# Patient Record
Sex: Female | Born: 1980 | Race: White | Hispanic: No | Marital: Married | Smoking: Former smoker
Health system: Southern US, Community
[De-identification: ages and names within clinical notes are randomized; demographics above are authoritative.]

## PROBLEM LIST (undated history)

## (undated) DIAGNOSIS — J209 Acute bronchitis, unspecified: Secondary | ICD-10-CM

## (undated) DIAGNOSIS — E119 Type 2 diabetes mellitus without complications: Secondary | ICD-10-CM

## (undated) DIAGNOSIS — Z87442 Personal history of urinary calculi: Secondary | ICD-10-CM

## (undated) DIAGNOSIS — Z8619 Personal history of other infectious and parasitic diseases: Secondary | ICD-10-CM

## (undated) DIAGNOSIS — R7303 Prediabetes: Secondary | ICD-10-CM

## (undated) DIAGNOSIS — D649 Anemia, unspecified: Secondary | ICD-10-CM

## (undated) DIAGNOSIS — F419 Anxiety disorder, unspecified: Secondary | ICD-10-CM

## (undated) DIAGNOSIS — J301 Allergic rhinitis due to pollen: Secondary | ICD-10-CM

## (undated) DIAGNOSIS — N390 Urinary tract infection, site not specified: Secondary | ICD-10-CM

## (undated) HISTORY — DX: Anemia, unspecified: D64.9

## (undated) HISTORY — DX: Type 2 diabetes mellitus without complications: E11.9

## (undated) HISTORY — DX: Urinary tract infection, site not specified: N39.0

## (undated) HISTORY — DX: Acute bronchitis, unspecified: J20.9

## (undated) HISTORY — DX: Allergic rhinitis due to pollen: J30.1

## (undated) HISTORY — DX: Anxiety disorder, unspecified: F41.9

## (undated) HISTORY — DX: Personal history of other infectious and parasitic diseases: Z86.19

## (undated) HISTORY — PX: NO PAST SURGERIES: SHX2092

---
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
## 2000-01-11 ENCOUNTER — Other Ambulatory Visit: Admission: RE | Admit: 2000-01-11 | Discharge: 2000-01-11 | Payer: Self-pay | Admitting: *Deleted

## 2001-03-14 ENCOUNTER — Other Ambulatory Visit: Admission: RE | Admit: 2001-03-14 | Discharge: 2001-03-14 | Payer: Self-pay | Admitting: *Deleted

## 2001-11-19 ENCOUNTER — Other Ambulatory Visit: Admission: RE | Admit: 2001-11-19 | Discharge: 2001-11-19 | Payer: Self-pay | Admitting: *Deleted

## 2002-04-03 ENCOUNTER — Other Ambulatory Visit: Admission: RE | Admit: 2002-04-03 | Discharge: 2002-04-03 | Payer: Self-pay | Admitting: *Deleted

## 2002-10-27 ENCOUNTER — Other Ambulatory Visit: Admission: RE | Admit: 2002-10-27 | Discharge: 2002-10-27 | Payer: Self-pay | Admitting: *Deleted

## 2004-01-15 ENCOUNTER — Other Ambulatory Visit: Admission: RE | Admit: 2004-01-15 | Discharge: 2004-01-15 | Payer: Self-pay | Admitting: Obstetrics and Gynecology

## 2005-01-30 ENCOUNTER — Other Ambulatory Visit: Admission: RE | Admit: 2005-01-30 | Discharge: 2005-01-30 | Payer: Self-pay | Admitting: Obstetrics and Gynecology

## 2007-05-17 LAB — CONVERTED CEMR LAB: Pap Smear: NORMAL

## 2008-04-10 ENCOUNTER — Ambulatory Visit: Payer: Self-pay | Admitting: *Deleted

## 2008-04-10 DIAGNOSIS — D649 Anemia, unspecified: Secondary | ICD-10-CM

## 2008-04-10 DIAGNOSIS — L299 Pruritus, unspecified: Secondary | ICD-10-CM | POA: Insufficient documentation

## 2008-04-10 DIAGNOSIS — F419 Anxiety disorder, unspecified: Secondary | ICD-10-CM | POA: Insufficient documentation

## 2008-04-10 DIAGNOSIS — R599 Enlarged lymph nodes, unspecified: Secondary | ICD-10-CM | POA: Insufficient documentation

## 2008-04-10 DIAGNOSIS — H60399 Other infective otitis externa, unspecified ear: Secondary | ICD-10-CM | POA: Insufficient documentation

## 2008-04-10 DIAGNOSIS — F411 Generalized anxiety disorder: Secondary | ICD-10-CM

## 2008-05-15 ENCOUNTER — Ambulatory Visit: Payer: Self-pay | Admitting: *Deleted

## 2009-02-17 ENCOUNTER — Ambulatory Visit: Payer: Self-pay | Admitting: Internal Medicine

## 2009-02-17 DIAGNOSIS — R300 Dysuria: Secondary | ICD-10-CM

## 2009-09-29 ENCOUNTER — Telehealth: Payer: Self-pay | Admitting: Internal Medicine

## 2010-10-18 NOTE — Progress Notes (Signed)
Summary: Yeast & Sinus Infection  Phone Note Call from Patient Call back at [phone]   Summary of Call: patient called and left voice message requesting a rx for a yeast and sinus infection. Her message states that she is unable to schedule an appointment due to her work schedule and she can not afford the copay as she does not get paid for another week.    Call was returned to patient at [phone], patient was not available, sent to voice mail. Voice message was left informing patient she has not been seen in7 months and she will need and office visit  to address her concerns.  She was advised to cal lback and schedule appointment or with any additional questions that she may have Initial call taken by: Glendell Docker CMA,  September 29, 2009 3:13 PM

## 2011-02-16 ENCOUNTER — Ambulatory Visit: Payer: Self-pay | Admitting: Family Medicine

## 2011-02-20 ENCOUNTER — Encounter: Payer: Self-pay | Admitting: Internal Medicine

## 2011-02-21 ENCOUNTER — Encounter: Payer: Self-pay | Admitting: Family Medicine

## 2011-02-21 ENCOUNTER — Ambulatory Visit (INDEPENDENT_AMBULATORY_CARE_PROVIDER_SITE_OTHER): Payer: BC Managed Care – PPO | Admitting: Family Medicine

## 2011-02-21 ENCOUNTER — Ambulatory Visit: Payer: Self-pay | Admitting: Internal Medicine

## 2011-02-21 ENCOUNTER — Other Ambulatory Visit: Payer: Self-pay | Admitting: Family Medicine

## 2011-02-21 DIAGNOSIS — Z0000 Encounter for general adult medical examination without abnormal findings: Secondary | ICD-10-CM

## 2011-02-21 DIAGNOSIS — R5381 Other malaise: Secondary | ICD-10-CM

## 2011-02-21 DIAGNOSIS — R635 Abnormal weight gain: Secondary | ICD-10-CM | POA: Insufficient documentation

## 2011-02-21 DIAGNOSIS — N921 Excessive and frequent menstruation with irregular cycle: Secondary | ICD-10-CM

## 2011-02-21 DIAGNOSIS — F17200 Nicotine dependence, unspecified, uncomplicated: Secondary | ICD-10-CM

## 2011-02-21 DIAGNOSIS — Z87891 Personal history of nicotine dependence: Secondary | ICD-10-CM | POA: Insufficient documentation

## 2011-02-21 DIAGNOSIS — F411 Generalized anxiety disorder: Secondary | ICD-10-CM

## 2011-02-21 DIAGNOSIS — R5383 Other fatigue: Secondary | ICD-10-CM | POA: Insufficient documentation

## 2011-02-21 NOTE — Progress Notes (Signed)
OFFICE VISIT  02/21/2011   CC:  Chief Complaint  Patient presents with  . Employment Physical    medical paperwork for foster care     HPI:    Patient is a 30 y.o. Caucasian female who presents for routine annual CPE. Sees GYN MD for pap/pelvic (most recent was a few weeks ago). Pt reports about 3-4 mo of excessive wt gain--40 lbs or so, worsening irregularity of menses (LMP 01/05/11), rare episodes of lightheadedness and tremulousness when hungry and it is near mealtime.  Fatigued and excessively sleepy the last few weeks.  Appetite good.  No n/v/d or constipation but +bloated/gassy lately.  No dysuria, urgency, urinary frequency, or hematuria.  No HAs, vision changes, or rash.  No CP, SOB, palpitations, or LE swelling problems.  +Snores but no comment by husband about apneic periods in sleep. Currently does no exercise.  Chronic anxiety relatively unproblematic lately: takes clonaz about 1-2 times per month.  She is going to be a foster parent so she has a health form for me to fill out today.  Past Medical History  Diagnosis Date  . History of chicken pox   . Hay fever     with allergies  . Frequent UTI   . Anemia   . Anxiety     Past Surgical History  Procedure Date  . No past surgeries     denies surgical history    Outpatient Prescriptions Prior to Visit  Medication Sig Dispense Refill  . clonazePAM (KLONOPIN) 0.5 MG tablet Take 0.5 mg by mouth 2 (two) times daily as needed.        . nitrofurantoin (MACRODANTIN) 100 MG capsule Take 100 mg by mouth. Take one tablet by mouth once daily as needed for bladder swelling         No Known Allergies  ROS As per HPI  PE: Blood pressure 114/60, pulse 84, temperature 98.1 F (36.7 C), temperature source Oral, resp. rate 18, height 5' 1.5" (1.562 m), weight 201 lb (91.173 kg), last menstrual period 01/05/2011, SpO2 98.00%. Gen: Alert, well appearing.  Patient is oriented to person, place, time, and situation.   Obese-appearing. HEENT: Scalp without lesions or hair loss.  Ears: EACs clear, normal epithelium.  TMs with good light reflex and landmarks bilaterally.  Eyes: no injection, icteris, swelling, or exudate.  EOMI, PERRLA. Nose: no drainage or turbinate edema/swelling.  No injection or focal lesion.  Mouth: lips without lesion/swelling.  Oral mucosa pink and moist.  Dentition intact and without obvious caries or gingival swelling.  Oropharynx without erythema, exudate, or swelling.  Neck: supple, ROM full.  Carotids 2+ bilat, without bruit.  No lymphadenopathy, thyromegaly, or mass. Chest: symmetric expansion, nonlabored respirations.  Clear and equal breath sounds in all lung fields.   CV: RRR, no m/r/g.  Peripheral pulses 2+ and symmetric. ABD: soft, NT, ND, BS normal.  No hepatospenomegaly or mass.  No bruits. EXT: no clubbing, cyanosis, or edema.   LABS:  UPT today was NEGATIVE  IMPRESSION AND PLAN:  Health maintenance examination Discussed prudent diet, regular physical exercise, wt loss. She'll continue cervical cancer screening through her GYN. Routine annual exam 1 yr.  Weight gain With worsening of her metrorrhagia, fatigue, and occasional periods of dizziness/tremulousness. Eval with TSH, CBC, CMET, HbA1c. UPT negative here today. She says she and her GYN plan on proceeding with a more detailed hormonal evaluation.  Tobacco dependence Encouraged cessation.  Patient not ready to quit at this time.  ANXIETY Stable.  May continue prn use of clonazepam.  No rx given today.   I filled out her health form today so she can participate in foster care program.  FOLLOW UP: Return if symptoms worsen or fail to improve.

## 2011-02-21 NOTE — Assessment & Plan Note (Signed)
Discussed prudent diet, regular physical exercise, wt loss. She'll continue cervical cancer screening through her GYN. Routine annual exam 1 yr.

## 2011-02-21 NOTE — Assessment & Plan Note (Addendum)
With worsening of her metrorrhagia, fatigue, and occasional periods of dizziness/tremulousness. Eval with TSH, CBC, CMET, HbA1c. UPT negative here today. She says she and her GYN plan on proceeding with a more detailed hormonal evaluation.

## 2011-02-21 NOTE — Assessment & Plan Note (Signed)
Encouraged cessation.  Patient not ready to quit at this time.

## 2011-02-21 NOTE — Assessment & Plan Note (Signed)
Stable. May continue prn use of clonazepam.  No rx given today.

## 2011-02-22 ENCOUNTER — Telehealth: Payer: Self-pay | Admitting: Family Medicine

## 2011-02-22 LAB — TSH: TSH: 1.072 u[IU]/mL (ref 0.350–4.500)

## 2011-02-22 LAB — CBC WITH DIFFERENTIAL/PLATELET
Eosinophils Absolute: 0.3 10*3/uL (ref 0.0–0.7)
Hemoglobin: 14.4 g/dL (ref 12.0–15.0)
Lymphs Abs: 3.8 10*3/uL (ref 0.7–4.0)
MCH: 30.6 pg (ref 26.0–34.0)
Monocytes Relative: 9 % (ref 3–12)
Neutro Abs: 6.5 10*3/uL (ref 1.7–7.7)
Neutrophils Relative %: 56 % (ref 43–77)
RBC: 4.7 MIL/uL (ref 3.87–5.11)

## 2011-02-22 LAB — HEMOGLOBIN A1C: Hgb A1c MFr Bld: 5.5 % (ref <5.7)

## 2011-02-22 LAB — COMPREHENSIVE METABOLIC PANEL
Albumin: 4.3 g/dL (ref 3.5–5.2)
CO2: 27 mEq/L (ref 19–32)
Calcium: 9.3 mg/dL (ref 8.4–10.5)
Glucose, Bld: 118 mg/dL — ABNORMAL HIGH (ref 70–99)
Potassium: 4 mEq/L (ref 3.5–5.3)
Sodium: 139 mEq/L (ref 135–145)
Total Protein: 7.1 g/dL (ref 6.0–8.3)

## 2011-02-22 NOTE — Telephone Encounter (Signed)
Patient advised of lab results. See lab note 02/22/2011

## 2011-02-22 NOTE — Telephone Encounter (Signed)
Pt states that she is returning your phone call?

## 2011-03-17 ENCOUNTER — Encounter: Payer: Self-pay | Admitting: Family

## 2011-03-17 ENCOUNTER — Other Ambulatory Visit: Payer: Self-pay | Admitting: Family

## 2011-03-17 ENCOUNTER — Ambulatory Visit (HOSPITAL_BASED_OUTPATIENT_CLINIC_OR_DEPARTMENT_OTHER)
Admission: RE | Admit: 2011-03-17 | Discharge: 2011-03-17 | Disposition: A | Discharge status: Home / Self Care | Payer: BC Managed Care – PPO | Source: Ambulatory Visit | Attending: Family | Admitting: Family

## 2011-03-17 ENCOUNTER — Ambulatory Visit (INDEPENDENT_AMBULATORY_CARE_PROVIDER_SITE_OTHER): Payer: BC Managed Care – PPO | Admitting: Family

## 2011-03-17 ENCOUNTER — Telehealth: Payer: Self-pay | Admitting: Family

## 2011-03-17 DIAGNOSIS — R319 Hematuria, unspecified: Secondary | ICD-10-CM | POA: Insufficient documentation

## 2011-03-17 DIAGNOSIS — N200 Calculus of kidney: Secondary | ICD-10-CM | POA: Insufficient documentation

## 2011-03-17 DIAGNOSIS — N949 Unspecified condition associated with female genital organs and menstrual cycle: Secondary | ICD-10-CM

## 2011-03-17 DIAGNOSIS — Z0189 Encounter for other specified special examinations: Secondary | ICD-10-CM

## 2011-03-17 DIAGNOSIS — R102 Pelvic and perineal pain: Secondary | ICD-10-CM

## 2011-03-17 DIAGNOSIS — Z111 Encounter for screening for respiratory tuberculosis: Secondary | ICD-10-CM

## 2011-03-17 DIAGNOSIS — R109 Unspecified abdominal pain: Secondary | ICD-10-CM | POA: Insufficient documentation

## 2011-03-17 LAB — POCT URINALYSIS DIPSTICK
Protein, UA: NEGATIVE
Spec Grav, UA: 1.01
Urobilinogen, UA: 0.2

## 2011-03-17 MED ORDER — CIPROFLOXACIN HCL 500 MG PO TABS: 500.0000 mg | ORAL_TABLET | Freq: Two times a day (BID) | ORAL | Status: AC

## 2011-03-17 NOTE — Patient Instructions (Addendum)
Please complete your Ct scan and blood work on the first floor today. Call or go to the ED if you develop fever over 101, worsening low back pain, increased blood in the urine, if symptoms worsen or if they do not improve.  Follow up Monday to have your PPD read.

## 2011-03-17 NOTE — Telephone Encounter (Signed)
Called patient,  Reviewed findings of CT scan.  Recommended that she take cipro and that she contact us if symptoms worsen or do not improve.  F/u in 1-2 weeks- sooner if problems or concerns.

## 2011-03-17 NOTE — Assessment & Plan Note (Signed)
30 yr old female with pelvic pain, hematuria.  UA + blood, otherwise unremarkable.  Will send urine for culture, treat empirically with cipro.  Will also send her for CT abd/pelvis with kidney stone protocol to exlude stone.  Check CBC to assess WBC.

## 2011-03-17 NOTE — Progress Notes (Signed)
  Subjective:    Patient ID: Anita Cox, female    DOB: [birth year], 30 y.o.   MRN: [id number]  HPI  Anita Cox is a 30 yr old female who presents today with chief complaint of of pelvic pain.  Pain started about 24 hrs ago. Pain is associated with  + urinary urgency and  + discomfort.  Urine appears "pink" on the tissue.  Denies associated fever.   + bilateral low back pain.  Denies nausea.  Some improvement in her symptoms with the use of Motrin. Symptoms feel similar to her previous symptoms.  Reports + history of UTI's and carries diagnosis of "painful bladder syndrome."    Review of Systems Se HPI  Past Medical History  Diagnosis Date  . History of chicken pox   . Hay fever     with allergies  . Frequent UTI   . Anemia   . Anxiety     History   Social History  . Marital Status: Married    Spouse Name: N/A    Number of Children: N/A  . Years of Education: N/A   Occupational History  . Not on file.   Social History Main Topics  . Smoking status: Current Everyday Smoker  . Smokeless tobacco: Not on file  . Alcohol Use: No  . Drug Use: No  . Sexually Active: Not on file   Other Topics Concern  . Not on file   Social History Narrative  . No narrative on file    Past Surgical History  Procedure Date  . No past surgeries     denies surgical history    Family History  Problem Relation Age of Onset  . Arthritis Mother   . Diabetes      grandmother  . Diabetes Paternal Grandfather   . Diabetes Paternal Grandmother     No Known Allergies  Current Outpatient Prescriptions on File Prior to Visit  Medication Sig Dispense Refill  . clonazePAM (KLONOPIN) 0.5 MG tablet Take 0.5 mg by mouth 2 (two) times daily as needed.        Marland Kitchen DISCONTD: nitrofurantoin (MACRODANTIN) 100 MG capsule Take 100 mg by mouth. Take one tablet by mouth once daily as needed for bladder swelling         BP 96/60  Pulse 78  Temp(Src) 98.2 F (36.8 C) (Oral)  Resp 16  Wt 202 lb  (91.627 kg)  LMP 01/05/2011       Objective:   Physical Exam  Constitutional: She appears well-developed and well-nourished.  Cardiovascular: Normal rate and regular rhythm.   Pulmonary/Chest: Effort normal and breath sounds normal.  Abdominal: Soft. Bowel sounds are normal. She exhibits no distension.       Mild right lower quadrant tenderness to palpation without guarding.  Skin: Skin is warm and dry.  Psychiatric: She has a normal mood and affect. Her behavior is normal. Judgment and thought content normal.  GU: neg CVAT bilaterally        Assessment & Plan:   BP Readings from Last 3 Encounters:  03/17/11 96/60  02/21/11 114/60  02/17/09 106/60

## 2011-03-18 LAB — CBC WITH DIFFERENTIAL/PLATELET
Eosinophils Absolute: 0.4 10*3/uL (ref 0.0–0.7)
Eosinophils Relative: 3 % (ref 0–5)
Lymphs Abs: 5 10*3/uL — ABNORMAL HIGH (ref 0.7–4.0)
MCH: 30.4 pg (ref 26.0–34.0)
MCV: 88.5 fL (ref 78.0–100.0)
Monocytes Absolute: 0.9 10*3/uL (ref 0.1–1.0)
Platelets: 272 10*3/uL (ref 150–400)
RBC: 4.7 MIL/uL (ref 3.87–5.11)
RDW: 12.9 % (ref 11.5–15.5)

## 2011-03-19 LAB — URINE CULTURE: Colony Count: NO GROWTH

## 2011-03-20 ENCOUNTER — Telehealth: Payer: Self-pay | Admitting: Family

## 2011-03-20 ENCOUNTER — Ambulatory Visit: Payer: BC Managed Care – PPO | Admitting: Family

## 2011-03-20 NOTE — Telephone Encounter (Signed)
Urine culture neg.  Called pt's cell, reviewed negative urine culture. She states that she started feeling better yesterday afternoon.

## 2011-12-27 ENCOUNTER — Encounter: Payer: Self-pay | Admitting: Family

## 2011-12-27 ENCOUNTER — Ambulatory Visit (HOSPITAL_BASED_OUTPATIENT_CLINIC_OR_DEPARTMENT_OTHER)
Admission: RE | Admit: 2011-12-27 | Discharge: 2011-12-27 | Disposition: A | Discharge status: Home / Self Care | Payer: BC Managed Care – PPO | Source: Ambulatory Visit | Attending: Family | Admitting: Family

## 2011-12-27 ENCOUNTER — Ambulatory Visit (INDEPENDENT_AMBULATORY_CARE_PROVIDER_SITE_OTHER): Payer: BC Managed Care – PPO | Admitting: Family

## 2011-12-27 VITALS — BP 130/90 | HR 96 | Temp 97.8°F | Resp 18 | Wt 215.1 lb

## 2011-12-27 DIAGNOSIS — J029 Acute pharyngitis, unspecified: Secondary | ICD-10-CM

## 2011-12-27 DIAGNOSIS — R0989 Other specified symptoms and signs involving the circulatory and respiratory systems: Secondary | ICD-10-CM

## 2011-12-27 DIAGNOSIS — J209 Acute bronchitis, unspecified: Secondary | ICD-10-CM | POA: Insufficient documentation

## 2011-12-27 DIAGNOSIS — R059 Cough, unspecified: Secondary | ICD-10-CM | POA: Insufficient documentation

## 2011-12-27 DIAGNOSIS — F17200 Nicotine dependence, unspecified, uncomplicated: Secondary | ICD-10-CM

## 2011-12-27 DIAGNOSIS — H6691 Otitis media, unspecified, right ear: Secondary | ICD-10-CM | POA: Insufficient documentation

## 2011-12-27 DIAGNOSIS — Z0189 Encounter for other specified special examinations: Secondary | ICD-10-CM

## 2011-12-27 DIAGNOSIS — J40 Bronchitis, not specified as acute or chronic: Secondary | ICD-10-CM

## 2011-12-27 DIAGNOSIS — H6690 Otitis media, unspecified, unspecified ear: Secondary | ICD-10-CM

## 2011-12-27 DIAGNOSIS — R05 Cough: Secondary | ICD-10-CM

## 2011-12-27 HISTORY — DX: Acute bronchitis, unspecified: J20.9

## 2011-12-27 LAB — POCT URINE HCG BY VISUAL COLOR COMPARISON TESTS: Preg Test, Ur: NEGATIVE

## 2011-12-27 MED ORDER — BENZONATATE 100 MG PO CAPS: 100.0000 mg | ORAL_CAPSULE | Freq: Three times a day (TID) | ORAL | Status: AC | PRN

## 2011-12-27 MED ORDER — ALBUTEROL SULFATE HFA 108 (90 BASE) MCG/ACT IN AERS: 2.0000 | INHALATION_SPRAY | Freq: Four times a day (QID) | RESPIRATORY_TRACT | Status: DC | PRN

## 2011-12-27 MED ORDER — CEFUROXIME AXETIL 500 MG PO TABS: 500.0000 mg | ORAL_TABLET | Freq: Two times a day (BID) | ORAL | Status: AC

## 2011-12-27 NOTE — Assessment & Plan Note (Addendum)
CXR is negative for pneumonia.  Plan to treat with ceftin and albuterol. Spoke with pt on phone re: cxr results and ceftin rx.

## 2011-12-27 NOTE — Assessment & Plan Note (Signed)
Will rx with ceftin.   

## 2011-12-27 NOTE — Progress Notes (Signed)
  Subjective:    Patient ID: Anita Cox, female    DOB: 1980-11-13, 31 y.o.   MRN: [id number]  HPI  Ms.  Wyszynski is a 31 yr old female who presents today with chief complaint of cough.  She reports that she was sick 1 month ago- went to urgent care- placed on abx.  "got over it."  Last week got sick on vacation-  Had cough, chest congestion- started 1 week ago.  Took some otc meds- helped a little.  Monday- coughed all day.  Yesterday, right ear started to bother her.  Cough is productive of yellow phlegm.  Has a sore throat which is "pretty bad." Denies associated fever.  Still smokes.   Review of Systems See HPI  Past Medical History  Diagnosis Date  . History of chicken pox   . Hay fever     with allergies  . Frequent UTI   . Anemia   . Anxiety     History   Social History  . Marital Status: Married    Spouse Name: N/A    Number of Children: N/A  . Years of Education: N/A   Occupational History  . Not on file.   Social History Main Topics  . Smoking status: Current Everyday Smoker  . Smokeless tobacco: Not on file  . Alcohol Use: No  . Drug Use: No  . Sexually Active: Not on file   Other Topics Concern  . Not on file   Social History Narrative  . No narrative on file    Past Surgical History  Procedure Date  . No past surgeries     denies surgical history    Family History  Problem Relation Age of Onset  . Arthritis Mother   . Diabetes      grandmother  . Diabetes Paternal Grandfather   . Diabetes Paternal Grandmother     No Known Allergies  Current Outpatient Prescriptions on File Prior to Visit  Medication Sig Dispense Refill  . clonazePAM (KLONOPIN) 0.5 MG tablet Take 0.5 mg by mouth 2 (two) times daily as needed.        Marland Kitchen albuterol (PROVENTIL HFA;VENTOLIN HFA) 108 (90 BASE) MCG/ACT inhaler Inhale 2 puffs into the lungs every 6 (six) hours as needed for wheezing.  1 Inhaler  0    BP 130/90  Pulse 96  Temp(Src) 97.8 F (36.6 C) (Oral)   Resp 18  Wt 215 lb 1.3 oz (97.56 kg)  SpO2 99%  LMP 12/13/2011       Objective:   Physical Exam  Constitutional: She appears well-developed and well-nourished. No distress.  HENT:  Right Ear: Tympanic membrane is perforated, erythematous and retracted. Tympanic membrane is not bulging.  Left Ear: Tympanic membrane and ear canal normal.  Mouth/Throat: Posterior oropharyngeal erythema present. No oropharyngeal exudate or posterior oropharyngeal edema.  Cardiovascular: Normal rate and regular rhythm.   No murmur heard. Pulmonary/Chest: Effort normal. She has no wheezes. She has rhonchi in the right middle field and the right lower field.  Psychiatric: She has a normal mood and affect. Her behavior is normal. Judgment and thought content normal.          Assessment & Plan:

## 2011-12-27 NOTE — Patient Instructions (Signed)
Please complete your chest x-ray on the first floor.  Call if symptoms worsen, or if no improvement in 2-3 days.  

## 2011-12-27 NOTE — Assessment & Plan Note (Signed)
We discussed importance of smoking cessation 

## 2012-04-18 ENCOUNTER — Ambulatory Visit (INDEPENDENT_AMBULATORY_CARE_PROVIDER_SITE_OTHER): Payer: BC Managed Care – PPO | Admitting: Family Medicine

## 2012-04-18 ENCOUNTER — Encounter: Payer: Self-pay | Admitting: Family Medicine

## 2012-04-18 VITALS — BP 124/91 | HR 80 | Temp 98.0°F | Ht 61.5 in | Wt 215.8 lb

## 2012-04-18 DIAGNOSIS — J209 Acute bronchitis, unspecified: Secondary | ICD-10-CM

## 2012-04-18 DIAGNOSIS — J40 Bronchitis, not specified as acute or chronic: Secondary | ICD-10-CM

## 2012-04-18 DIAGNOSIS — F17200 Nicotine dependence, unspecified, uncomplicated: Secondary | ICD-10-CM

## 2012-04-18 MED ORDER — CIPROFLOXACIN HCL 500 MG PO TABS: 500.0000 mg | ORAL_TABLET | Freq: Two times a day (BID) | ORAL | Status: AC

## 2012-04-18 MED ORDER — ALBUTEROL SULFATE HFA 108 (90 BASE) MCG/ACT IN AERS: 2.0000 | INHALATION_SPRAY | Freq: Four times a day (QID) | RESPIRATORY_TRACT | Status: DC | PRN

## 2012-04-18 MED ORDER — HYDROCOD POLST-CHLORPHEN POLST 10-8 MG/5ML PO LQCR: 5.0000 mL | Freq: Every evening | ORAL | Status: DC | PRN

## 2012-04-18 NOTE — Assessment & Plan Note (Signed)
Ventolin sample given 2 puffs po q 6 hours prn sob/wheezing. Tussionex prn cough, qhs. Ciprofloxacin 500 mg po bid x 10 days

## 2012-04-18 NOTE — Patient Instructions (Addendum)

## 2012-04-18 NOTE — Progress Notes (Signed)
Patient ID: Anita Cox, female   DOB: 04-26-1981, 31 y.o.   MRN: [id number] Anita Cox [id number] [birth year] 04/18/2012      Progress Note-Follow Up  Subjective  Chief Complaint  Chief Complaint  Patient presents with  . not feeling well    runny nose, headache, chest congestion, cough X 1 week    HPI  Is a 31 year old Caucasian female who is in today with a one-week history of worsening respiratory symptoms. She is a smoker who generally smokes a pack per day but is now down to 7 cigarettes daily. She struggling with nasal congestion productive of clear to whitish thick phlegm. Also complaining of chest congestion and wheezing. Cough is minimally productive thus far. Cough is keeping her up at night. No fevers chills but she struggling with fatigue. No myalgias. Denies ear pain, throat pain, headache, vomiting or diarrhea. Does have some intermittent low-grade nausea. Has used Mucinex intermittently as well as a generic cough and cold suppressant in the last 24 hours. She notes some mild palpitations with coughing but otherwise denies chest pain  Past Medical History  Diagnosis Date  . History of chicken pox   . Hay fever     with allergies  . Frequent UTI   . Anemia   . Anxiety   . Acute bronchitis 12/27/2011    Past Surgical History  Procedure Date  . No past surgeries     denies surgical history    Family History  Problem Relation Age of Onset  . Arthritis Mother   . Diabetes      grandmother  . Diabetes Paternal Grandfather   . Diabetes Paternal Grandmother     History   Social History  . Marital Status: Married    Spouse Name: N/A    Number of Children: N/A  . Years of Education: N/A   Occupational History  . Not on file.   Social History Main Topics  . Smoking status: Current Everyday Smoker  . Smokeless tobacco: Not on file  . Alcohol Use: No  . Drug Use: No  . Sexually Active: Not on file   Other Topics Concern  . Not on file   Social  History Narrative  . No narrative on file    Current Outpatient Prescriptions on File Prior to Visit  Medication Sig Dispense Refill  . clonazePAM (KLONOPIN) 0.5 MG tablet Take 0.5 mg by mouth 2 (two) times daily as needed.        Marland Kitchen albuterol (PROVENTIL HFA;VENTOLIN HFA) 108 (90 BASE) MCG/ACT inhaler Inhale 2 puffs into the lungs every 6 (six) hours as needed for wheezing.  1 Inhaler  0  . metFORMIN (GLUCOPHAGE-XR) 500 MG 24 hr tablet       . VIORELE 0.15-0.02/0.01 MG (21/5) tablet         No Known Allergies  Review of Systems  Review of Systems  Constitutional: Positive for malaise/fatigue. Negative for fever and chills.  HENT: Positive for congestion. Negative for sore throat and ear discharge.   Eyes: Negative for discharge.  Respiratory: Positive for cough, sputum production, shortness of breath and wheezing.   Cardiovascular: Negative for chest pain, palpitations and leg swelling.  Gastrointestinal: Negative for nausea, vomiting, abdominal pain, diarrhea and constipation.  Genitourinary: Negative for dysuria.  Musculoskeletal: Negative for falls.  Skin: Negative for rash.  Neurological: Negative for loss of consciousness and headaches.  Endo/Heme/Allergies: Negative for polydipsia.  Psychiatric/Behavioral: Negative for depression and suicidal ideas. The patient is not  nervous/anxious and does not have insomnia.     Objective  BP 124/91  Pulse 80  Temp 98 F (36.7 C) (Oral)  Ht 5' 1.5" (1.562 m)  Wt 215 lb 12.8 oz (97.886 kg)  BMI 40.11 kg/m2  SpO2 93%  LMP 04/15/2012  Physical Exam  Physical Exam  Constitutional: She is oriented to person, place, and time and well-developed, well-nourished, and in no distress. No distress.  HENT:  Head: Normocephalic and atraumatic.  Eyes: Conjunctivae are normal.  Neck: Neck supple. No thyromegaly present.  Cardiovascular: Normal rate, regular rhythm and normal heart sounds.   No murmur heard. Pulmonary/Chest: She is in  respiratory distress. She has wheezes. She has no rales. She exhibits no tenderness.       Expiratory wheeze b/l bases  Abdominal: She exhibits no distension and no mass.  Musculoskeletal: She exhibits no edema.  Lymphadenopathy:    She has no cervical adenopathy.  Neurological: She is alert and oriented to person, place, and time.  Skin: Skin is warm and dry. No rash noted. She is not diaphoretic.  Psychiatric: Memory, affect and judgment normal.    Lab Results  Component Value Date   TSH 1.072 02/21/2011   Lab Results  Component Value Date   WBC 11.6* 03/17/2011   HGB 14.3 03/17/2011   HCT 41.6 03/17/2011   MCV 88.5 03/17/2011   PLT 272 03/17/2011   Lab Results  Component Value Date   CREATININE 0.68 02/21/2011   BUN 11 02/21/2011   NA 139 02/21/2011   K 4.0 02/21/2011   CL 104 02/21/2011   CO2 27 02/21/2011   Lab Results  Component Value Date   ALT 20 02/21/2011   AST 18 02/21/2011   ALKPHOS 70 02/21/2011   BILITOT 0.2* 02/21/2011     Assessment & Plan  Acute bronchitis Ventolin sample given 2 puffs po q 6 hours prn sob/wheezing. Tussionex prn cough, qhs. Ciprofloxacin 500 mg po bid x 10 days  Tobacco dependence  Patient is down from one pack per day to 7 cigarettes daily now that she feels poorly. She is encouraged not to go back up. She is further encouraged to try avoiding a cigarette and time she craves on for at least 15 minutes and to attempt complete cessation

## 2012-04-18 NOTE — Assessment & Plan Note (Signed)
Patient is down from one pack per day to 7 cigarettes daily now that she feels poorly. She is encouraged not to go back up. She is further encouraged to try avoiding a cigarette and time she craves on for at least 15 minutes and to attempt complete cessation

## 2012-05-01 ENCOUNTER — Encounter: Payer: Self-pay | Admitting: Family

## 2012-05-01 ENCOUNTER — Ambulatory Visit (INDEPENDENT_AMBULATORY_CARE_PROVIDER_SITE_OTHER): Payer: BC Managed Care – PPO | Admitting: Family

## 2012-05-01 VITALS — BP 102/80 | HR 85 | Temp 98.0°F | Resp 16 | Ht 61.5 in | Wt 214.1 lb

## 2012-05-01 DIAGNOSIS — A088 Other specified intestinal infections: Secondary | ICD-10-CM

## 2012-05-01 DIAGNOSIS — A084 Viral intestinal infection, unspecified: Secondary | ICD-10-CM | POA: Insufficient documentation

## 2012-05-01 NOTE — Assessment & Plan Note (Signed)
Recommended that she drink plenty of fluids.  May use immodium prn. Call if symptoms worsen, or if no improvement in 2-3 days.  Given family's illness, likely infectious.  However, if she does not improve, will need to consider stool studies for C. Diff given recent abx use.

## 2012-05-01 NOTE — Progress Notes (Signed)
Subjective:    Patient ID: Anita Cox, female    DOB: [birth year], 31 y.o.   MRN: [id number]  HPI  Anita Cox is a 31 yr old female who presents today for follow up. She reports a 4 day history of diarrhea.   Reports 5-6 stools a day. Denies black/bloody, no fevers.  + abdominal cramping.  Husband had same symptoms last week.  Her 32 year old woke up with diarrhea today.   Review of Systems    see HPI  Past Medical History  Diagnosis Date  . History of chicken pox   . Hay fever     with allergies  . Frequent UTI   . Anemia   . Anxiety   . Acute bronchitis 12/27/2011    History   Social History  . Marital Status: Married    Spouse Name: N/A    Number of Children: N/A  . Years of Education: N/A   Occupational History  . Not on file.   Social History Main Topics  . Smoking status: Current Everyday Smoker -- 0.5 packs/day    Types: Cigarettes  . Smokeless tobacco: Not on file  . Alcohol Use: No  . Drug Use: No  . Sexually Active: Not on file   Other Topics Concern  . Not on file   Social History Narrative  . No narrative on file    Past Surgical History  Procedure Date  . No past surgeries     denies surgical history    Family History  Problem Relation Age of Onset  . Arthritis Mother   . Diabetes      grandmother  . Diabetes Paternal Grandfather   . Diabetes Paternal Grandmother     No Known Allergies  Current Outpatient Prescriptions on File Prior to Visit  Medication Sig Dispense Refill  . clonazePAM (KLONOPIN) 0.5 MG tablet Take 0.5 mg by mouth 2 (two) times daily as needed.        . metFORMIN (GLUCOPHAGE-XR) 500 MG 24 hr tablet Take 500 mg by mouth daily with breakfast.       . VIORELE 0.15-0.02/0.01 MG (21/5) tablet Take 1 tablet by mouth daily.       Marland Kitchen albuterol (PROVENTIL HFA;VENTOLIN HFA) 108 (90 BASE) MCG/ACT inhaler Inhale 2 puffs into the lungs every 6 (six) hours as needed for wheezing.  1 Inhaler  0  . chlorpheniramine-HYDROcodone  (TUSSIONEX PENNKINETIC ER) 10-8 MG/5ML LQCR Take 5 mLs by mouth at bedtime as needed.  140 mL  1  . DISCONTD: albuterol (PROVENTIL HFA;VENTOLIN HFA) 108 (90 BASE) MCG/ACT inhaler Inhale 2 puffs into the lungs every 6 (six) hours as needed for wheezing or shortness of breath.  1 Inhaler  o    BP 102/80  Pulse 85  Temp 98 F (36.7 C) (Oral)  Resp 16  Ht 5' 1.5" (1.562 m)  Wt 214 lb 1.3 oz (97.106 kg)  BMI 39.80 kg/m2  SpO2 99%  LMP 04/15/2012    Objective:   Physical Exam  Constitutional: She appears well-developed and well-nourished. No distress.  Cardiovascular: Normal rate and regular rhythm.   No murmur heard. Pulmonary/Chest: Effort normal and breath sounds normal. No respiratory distress. She has no wheezes. She has no rales. She exhibits no tenderness.  Abdominal: Soft. Bowel sounds are normal. She exhibits no distension. There is no tenderness. There is no rebound and no guarding.  Musculoskeletal: She exhibits no edema.  Skin: Skin is warm and dry.  Psychiatric: She has  a normal mood and affect. Her behavior is normal. Thought content normal.          Assessment & Plan:

## 2012-05-01 NOTE — Patient Instructions (Addendum)
Viral Gastroenteritis Viral gastroenteritis is also known as stomach flu. This condition affects the stomach and intestinal tract. It can cause sudden diarrhea and vomiting. The illness typically lasts 3 to 8 days. Most people develop an immune response that eventually gets rid of the virus. While this natural response develops, the virus can make you quite ill. CAUSES  Many different viruses can cause gastroenteritis, such as rotavirus or noroviruses. You can catch one of these viruses by consuming contaminated food or water. You may also catch a virus by sharing utensils or other personal items with an infected person or by touching a contaminated surface. SYMPTOMS  The most common symptoms are diarrhea and vomiting. These problems can cause a severe loss of body fluids (dehydration) and a body salt (electrolyte) imbalance. Other symptoms may include:  Fever.   Headache.   Fatigue.   Abdominal pain.  DIAGNOSIS  Your caregiver can usually diagnose viral gastroenteritis based on your symptoms and a physical exam. A stool sample may also be taken to test for the presence of viruses or other infections. TREATMENT  This illness typically goes away on its own. Treatments are aimed at rehydration. The most serious cases of viral gastroenteritis involve vomiting so severely that you are not able to keep fluids down. In these cases, fluids must be given through an intravenous line (IV). HOME CARE INSTRUCTIONS   Drink enough fluids to keep your urine clear or pale yellow. Drink small amounts of fluids frequently and increase the amounts as tolerated.   Ask your caregiver for specific rehydration instructions.   Avoid:   Foods high in sugar.   Alcohol.   Carbonated drinks.   Tobacco.   Juice.   Caffeine drinks.   Extremely hot or cold fluids.   Fatty, greasy foods.   Too much intake of anything at one time.   Dairy products until 24 to 48 hours after diarrhea stops.   You may  consume probiotics. Probiotics are active cultures of beneficial bacteria. They may lessen the amount and number of diarrheal stools in adults. Probiotics can be found in yogurt with active cultures and in supplements.   Wash your hands well to avoid spreading the virus.   Only take over-the-counter or prescription medicines for pain, discomfort, or fever as directed by your caregiver. Do not give aspirin to children. Antidiarrheal medicines are not recommended.   Ask your caregiver if you should continue to take your regular prescribed and over-the-counter medicines.   Keep all follow-up appointments as directed by your caregiver.  SEEK IMMEDIATE MEDICAL CARE IF:   You are unable to keep fluids down.   You do not urinate at least once every 6 to 8 hours.   You develop shortness of breath.   You notice blood in your stool or vomit. This may look like coffee grounds.   You have abdominal pain that increases or is concentrated in one small area (localized).   You have persistent vomiting or diarrhea.   You have a fever.   The patient is a child younger than 3 months, and he or she has a fever.   The patient is a child older than 3 months, and he or she has a fever and persistent symptoms.   The patient is a child older than 3 months, and he or she has a fever and symptoms suddenly get worse.   The patient is a baby, and he or she has no tears when crying.  MAKE SURE YOU:     Understand these instructions.   Will watch your condition.   Will get help right away if you are not doing well or get worse.  Document Released: 09/04/2005 Document Revised: 08/24/2011 Document Reviewed: 06/21/2011 [employer] Patient Information 2012 [employer]. 

## 2013-11-03 ENCOUNTER — Encounter: Payer: Self-pay | Admitting: Physician Assistant

## 2013-11-03 ENCOUNTER — Ambulatory Visit (INDEPENDENT_AMBULATORY_CARE_PROVIDER_SITE_OTHER): Payer: No Typology Code available for payment source | Admitting: Physician Assistant

## 2013-11-03 VITALS — BP 117/80 | HR 91 | Temp 98.9°F | Resp 16 | Ht 61.5 in | Wt 223.1 lb

## 2013-11-03 DIAGNOSIS — H6691 Otitis media, unspecified, right ear: Secondary | ICD-10-CM

## 2013-11-03 DIAGNOSIS — J329 Chronic sinusitis, unspecified: Secondary | ICD-10-CM | POA: Diagnosis not present

## 2013-11-03 DIAGNOSIS — H6690 Otitis media, unspecified, unspecified ear: Secondary | ICD-10-CM | POA: Diagnosis not present

## 2013-11-03 MED ORDER — AMOXICILLIN-POT CLAVULANATE 875-125 MG PO TABS: 1.0000 | ORAL_TABLET | Freq: Two times a day (BID) | ORAL | Status: DC

## 2013-11-03 NOTE — Assessment & Plan Note (Addendum)
Patient also with Maxillary sinusitis.  Rx Augmentin.

## 2013-11-03 NOTE — Progress Notes (Signed)
Patient presents to clinic today c/o 2 days of sinus pressure, sinus pain, postnasal drip and dry cough.  Patient also complains of pressure and pain of right ear first noticed upon wakening this am.  Patient also endorses tooth pain.  Patient denies recent travel or sick contact.  Denies change in hearing, tinnitus or ear drainage.  Denies shortness of breath, wheezing or pleuritic chest pain.  Past Medical History  Diagnosis Date  . History of chicken pox   . Hay fever     with allergies  . Frequent UTI   . Anemia   . Anxiety   . Acute bronchitis 12/27/2011    Current Outpatient Prescriptions on File Prior to Visit  Medication Sig Dispense Refill  . clonazePAM (KLONOPIN) 0.5 MG tablet Take 0.5 mg by mouth 2 (two) times daily as needed.        . metFORMIN (GLUCOPHAGE-XR) 500 MG 24 hr tablet Take 500 mg by mouth daily with breakfast.       . VIORELE 0.15-0.02/0.01 MG (21/5) tablet Take 1 tablet by mouth daily.        No current facility-administered medications on file prior to visit.    No Known Allergies  Family History  Problem Relation Age of Onset  . Arthritis Mother   . Diabetes      grandmother  . Diabetes Paternal Grandfather   . Diabetes Paternal Grandmother     History   Social History  . Marital Status: Married    Spouse Name: N/A    Number of Children: N/A  . Years of Education: N/A   Social History Main Topics  . Smoking status: Current Every Day Smoker -- 0.50 packs/day    Types: Cigarettes  . Smokeless tobacco: None  . Alcohol Use: No  . Drug Use: No  . Sexual Activity: None   Other Topics Concern  . None   Social History Narrative  . None    Review of Systems - See HPI.  All other ROS are negative.  BP 117/80  Pulse 91  Temp(Src) 98.9 F (37.2 C) (Oral)  Resp 16  Ht 5' 1.5" (1.562 m)  Wt 223 lb 2 oz (101.209 kg)  BMI 41.48 kg/m2  SpO2 98%  LMP 11/03/2013  Physical Exam  Vitals reviewed. Constitutional: She is oriented to person,  place, and time and well-developed, well-nourished, and in no distress.  HENT:  Head: Normocephalic and atraumatic.  Right Ear: External ear and ear canal normal. Tympanic membrane is erythematous and bulging.  Left Ear: Tympanic membrane, external ear and ear canal normal.  Nose: Nose normal.  Mouth/Throat: Uvula is midline, oropharynx is clear and moist and mucous membranes are normal. No oropharyngeal exudate.  Tenderness to percussion of right maxillary sinus.    Eyes: Conjunctivae are normal. Pupils are equal, round, and reactive to light.  Neck: Neck supple.  Cardiovascular: Normal rate, regular rhythm and normal heart sounds.   Pulmonary/Chest: Effort normal and breath sounds normal. No respiratory distress. She has no wheezes. She has no rales. She exhibits no tenderness.  Lymphadenopathy:    She has no cervical adenopathy.  Neurological: She is alert and oriented to person, place, and time.  Skin: Skin is warm and dry. No rash noted.  Psychiatric: Affect normal.   Assessment/Plan: Right otitis media Patient also with Maxillary sinusitis.  Rx Augmentin.    Sinusitis Rx Augmentin. Take antibiotic as prescribed.  Increase fluid intake.  Rest.  Saline nasal spray. Mucinex. Humidifier in bedroom.  Probiotic.  Please call or return to clinic if symptoms are not improving.

## 2013-11-03 NOTE — Progress Notes (Signed)
Pre visit review using our clinic review tool, if applicable. No additional management support is needed unless otherwise documented below in the visit note/SLS  

## 2013-11-03 NOTE — Patient Instructions (Signed)
Take antibiotic as prescribed.  Increase fluid intake.  Rest.  Saline nasal spray. Mucinex-DM. Humidifier in bedroom. Take a multivitamin.  Please call or return to clinic if symptoms are not improving.  Sinusitis Sinusitis is redness, soreness, and swelling (inflammation) of the paranasal sinuses. Paranasal sinuses are air pockets within the bones of your face (beneath the eyes, the middle of the forehead, or above the eyes). In healthy paranasal sinuses, mucus is able to drain out, and air is able to circulate through them by way of your nose. However, when your paranasal sinuses are inflamed, mucus and air can become trapped. This can allow bacteria and other germs to grow and cause infection. Sinusitis can develop quickly and last only a short time (acute) or continue over a long period (chronic). Sinusitis that lasts for more than 12 weeks is considered chronic.  CAUSES  Causes of sinusitis include:  Allergies.  Structural abnormalities, such as displacement of the cartilage that separates your nostrils (deviated septum), which can decrease the air flow through your nose and sinuses and affect sinus drainage.  Functional abnormalities, such as when the small hairs (cilia) that line your sinuses and help remove mucus do not work properly or are not present. SYMPTOMS  Symptoms of acute and chronic sinusitis are the same. The primary symptoms are pain and pressure around the affected sinuses. Other symptoms include:  Upper toothache.  Earache.  Headache.  Bad breath.  Decreased sense of smell and taste.  A cough, which worsens when you are lying flat.  Fatigue.  Fever.  Thick drainage from your nose, which often is green and may contain pus (purulent).  Swelling and warmth over the affected sinuses. DIAGNOSIS  Your caregiver will perform a physical exam. During the exam, your caregiver may:  Look in your nose for signs of abnormal growths in your nostrils (nasal  polyps).  Tap over the affected sinus to check for signs of infection.  View the inside of your sinuses (endoscopy) with a special imaging device with a light attached (endoscope), which is inserted into your sinuses. If your caregiver suspects that you have chronic sinusitis, one or more of the following tests may be recommended:  Allergy tests.  Nasal culture A sample of mucus is taken from your nose and sent to a lab and screened for bacteria.  Nasal cytology A sample of mucus is taken from your nose and examined by your caregiver to determine if your sinusitis is related to an allergy. TREATMENT  Most cases of acute sinusitis are related to a viral infection and will resolve on their own within 10 days. Sometimes medicines are prescribed to help relieve symptoms (pain medicine, decongestants, nasal steroid sprays, or saline sprays).  However, for sinusitis related to a bacterial infection, your caregiver will prescribe antibiotic medicines. These are medicines that will help kill the bacteria causing the infection.  Rarely, sinusitis is caused by a fungal infection. In theses cases, your caregiver will prescribe antifungal medicine. For some cases of chronic sinusitis, surgery is needed. Generally, these are cases in which sinusitis recurs more than 3 times per year, despite other treatments. HOME CARE INSTRUCTIONS   Drink plenty of water. Water helps thin the mucus so your sinuses can drain more easily.  Use a humidifier.  Inhale steam 3 to 4 times a day (for example, sit in the bathroom with the shower running).  Apply a warm, moist washcloth to your face 3 to 4 times a day, or as directed by  your caregiver.  Use saline nasal sprays to help moisten and clean your sinuses.  Take over-the-counter or prescription medicines for pain, discomfort, or fever only as directed by your caregiver. SEEK IMMEDIATE MEDICAL CARE IF:  You have increasing pain or severe headaches.  You have  nausea, vomiting, or drowsiness.  You have swelling around your face.  You have vision problems.  You have a stiff neck.  You have difficulty breathing. MAKE SURE YOU:   Understand these instructions.  Will watch your condition.  Will get help right away if you are not doing well or get worse. Document Released: 09/04/2005 Document Revised: 11/27/2011 Document Reviewed: 09/19/2011 [facility]Exit[facility] Patient Information 2014 [place], [place].  Otitis Media, Adult Otitis media is redness, soreness, and swelling (inflammation) of the middle ear. Otitis media may be caused by allergies or, most commonly, by infection. Often it occurs as a complication of the common cold. SIGNS AND SYMPTOMS Symptoms of otitis media may include:  Earache.  Fever.  Ringing in your ear.  Headache.  Leakage of fluid from the ear. DIAGNOSIS To diagnose otitis media, your health care provider will examine your ear with an otoscope. This is an instrument that allows your health care provider to see into your ear in order to examine your eardrum. Your health care provider also will ask you questions about your symptoms. TREATMENT  Typically, otitis media resolves on its own within 3 5 days. Your health care provider may prescribe medicine to ease your symptoms of pain. If otitis media does not resolve within 5 days or is recurrent, your health care provider may prescribe antibiotic medicines if he or she suspects that a bacterial infection is the cause. HOME CARE INSTRUCTIONS   Take your medicine as directed until it is gone, even if you feel better after the first few days.  Only take over-the-counter or prescription medicines for pain, discomfort, or fever as directed by your health care provider.  Follow up with your health care provider as directed. SEEK MEDICAL CARE IF:  You have otitis media only in one ear or bleeding from your nose or both.  You notice a lump on your neck.  You are not getting  better in 3 5 days.  You feel worse instead of better. SEEK IMMEDIATE MEDICAL CARE IF:   You have pain that is not controlled with medicine.  You have swelling, redness, or pain around your ear or stiffness in your neck.  You notice that part of your face is paralyzed.  You notice that the bone behind your ear (mastoid) is tender when you touch it. MAKE SURE YOU:   Understand these instructions.  Will watch your condition.  Will get help right away if you are not doing well or get worse. Document Released: 06/09/2004 Document Revised: 06/25/2013 Document Reviewed: 04/01/2013 [facility] Patient Information 2014 [place]Care, [place].

## 2013-11-03 NOTE — Assessment & Plan Note (Signed)
Rx Augmentin. Take antibiotic as prescribed.  Increase fluid intake.  Rest.  Saline nasal spray. Mucinex. Humidifier in bedroom. Probiotic.  Please call or return to clinic if symptoms are not improving.

## 2013-11-05 ENCOUNTER — Telehealth: Payer: Self-pay | Admitting: Family

## 2013-11-05 NOTE — Telephone Encounter (Signed)
Relevant patient education assigned to patient using Emmi. ° °

## 2014-09-14 ENCOUNTER — Ambulatory Visit (INDEPENDENT_AMBULATORY_CARE_PROVIDER_SITE_OTHER): Payer: No Typology Code available for payment source | Admitting: Medical

## 2014-09-14 ENCOUNTER — Encounter: Payer: Self-pay | Admitting: Medical

## 2014-09-14 ENCOUNTER — Ambulatory Visit (HOSPITAL_BASED_OUTPATIENT_CLINIC_OR_DEPARTMENT_OTHER)
Admission: RE | Admit: 2014-09-14 | Discharge: 2014-09-14 | Disposition: A | Discharge status: Home / Self Care | Payer: No Typology Code available for payment source | Source: Ambulatory Visit | Attending: Medical | Admitting: Medical

## 2014-09-14 VITALS — BP 133/85 | HR 92 | Temp 99.0°F | Ht 61.5 in | Wt 212.8 lb

## 2014-09-14 DIAGNOSIS — J189 Pneumonia, unspecified organism: Secondary | ICD-10-CM | POA: Insufficient documentation

## 2014-09-14 DIAGNOSIS — R059 Cough, unspecified: Secondary | ICD-10-CM

## 2014-09-14 DIAGNOSIS — R05 Cough: Secondary | ICD-10-CM

## 2014-09-14 DIAGNOSIS — Z87891 Personal history of nicotine dependence: Secondary | ICD-10-CM | POA: Diagnosis not present

## 2014-09-14 DIAGNOSIS — J209 Acute bronchitis, unspecified: Secondary | ICD-10-CM | POA: Insufficient documentation

## 2014-09-14 DIAGNOSIS — R062 Wheezing: Secondary | ICD-10-CM | POA: Insufficient documentation

## 2014-09-14 MED ORDER — AMOXICILLIN-POT CLAVULANATE 875-125 MG PO TABS: 1.0000 | ORAL_TABLET | Freq: Two times a day (BID) | ORAL | Status: DC

## 2014-09-14 MED ORDER — BENZONATATE 100 MG PO CAPS: 100.0000 mg | ORAL_CAPSULE | Freq: Three times a day (TID) | ORAL | Status: DC | PRN

## 2014-09-14 MED ORDER — BECLOMETHASONE DIPROPIONATE 40 MCG/ACT IN AERS: 2.0000 | INHALATION_SPRAY | Freq: Two times a day (BID) | RESPIRATORY_TRACT | Status: DC

## 2014-09-14 MED ORDER — ALBUTEROL SULFATE HFA 108 (90 BASE) MCG/ACT IN AERS: 2.0000 | INHALATION_SPRAY | Freq: Four times a day (QID) | RESPIRATORY_TRACT | Status: DC | PRN

## 2014-09-14 NOTE — Progress Notes (Signed)
Subjective:    Patient ID: Anita MoralesKaren Y Cox, female    DOB: 11/18/1980, 33 y.o.   MRN: [id number]  HPI   Pt in today reporting cough, nasal congestion and runny nose for 5 Days. Wheezing also. Was bad wheeze on christmas. No hx of asthma. Pt does smoke about a pack a day. Pt smokes 12 years. No productive cough but chest congestion. Dry cough all day.  Associated symptoms( below yes or no)  Fever- last night some. 100.3 Chills-yes Chest congestion-yes Sneezing- no Itching eyes-no Sore throat- yes, mild. Post-nasal drainage-yes Wheezing-yes Purulent nasal  drainage-yes, little. Fatigue-yes.mild.  LMP- 3 and half weeks.   Past Medical History  Diagnosis Date  . History of chicken pox   . Hay fever     with allergies  . Frequent UTI   . Anemia   . Anxiety   . Acute bronchitis 12/27/2011    History   Social History  . Marital Status: Married    Spouse Name: N/A    Number of Children: N/A  . Years of Education: N/A   Occupational History  . Not on file.   Social History Main Topics  . Smoking status: Current Every Day Smoker -- 0.50 packs/day    Types: Cigarettes  . Smokeless tobacco: Not on file  . Alcohol Use: No  . Drug Use: No  . Sexual Activity: Not on file   Other Topics Concern  . Not on file   Social History Narrative    Past Surgical History  Procedure Laterality Date  . No past surgeries      denies surgical history    Family History  Problem Relation Age of Onset  . Arthritis Mother   . Diabetes      grandmother  . Diabetes Paternal Grandfather   . Diabetes Paternal Grandmother     No Known Allergies  Current Outpatient Prescriptions on File Prior to Visit  Medication Sig Dispense Refill  . clonazePAM (KLONOPIN) 0.5 MG tablet Take 0.5 mg by mouth 2 (two) times daily as needed.      . metFORMIN (GLUCOPHAGE-XR) 500 MG 24 hr tablet Take 500 mg by mouth daily with breakfast.     . VIORELE 0.15-0.02/0.01 MG (21/5) tablet Take 1 tablet by  mouth daily.      No current facility-administered medications on file prior to visit.    BP 133/85 mmHg  Pulse 92  Temp(Src) 99 F (37.2 C) (Oral)  Ht 5' 1.5" (1.562 m)  Wt 212 lb 12.8 oz (96.525 kg)  BMI 39.56 kg/m2  SpO2 99%  LMP 08/15/2014       Review of Systems  Constitutional: Positive for fever, chills and fatigue.  HENT: Positive for congestion, postnasal drip, sinus pressure and sore throat.   Respiratory: Positive for cough and wheezing.        Dry cough.  Cardiovascular: Negative for chest pain and palpitations.  Musculoskeletal: Negative for back pain and neck pain.  Neurological: Negative for dizziness and headaches.  Hematological: Negative for adenopathy. Does not bruise/bleed easily.       Objective:   Physical Exam   General  Mental Status - Alert. General Appearance - Well groomed. Not in acute distress.  Skin Rashes- No Rashes.  HEENT Head- Normal. Ear Auditory Canal - Left- Normal. Right - Normal.Tympanic Membrane- Left- Normal. Right- Normal. Eye Sclera/Conjunctiva- Left- Normal. Right- Normal. Nose & Sinuses Nasal Mucosa- Left-  Mild boggy + Congested. Right-  Mild   boggy +Congested.  Left maxillary sinus pressure. Mouth & Throat Lips: Upper Lip- Normal: no dryness, cracking, pallor, cyanosis, or vesicular eruption. Lower Lip-Normal: no dryness, cracking, pallor, cyanosis or vesicular eruption. Buccal Mucosa- Bilateral- No Aphthous ulcers. Oropharynx- No Discharge or Erythema. Tonsils: Characteristics- Bilateral- No Erythema or Congestion. Size/Enlargement- Bilateral- No enlargement. Discharge- bilateral-None.  Neck Neck- Supple. No Masses.   Chest and Lung Exam Auscultation: Breath Sounds:- even and unlabored, but bilateral upper lobe rhonchi.  Cardiovascular Auscultation:Rythm- Regular, rate and rhythm. Murmurs & Other Heart Sounds:Ausculatation of the heart reveal- No Murmurs.  Lymphatic Head & Neck General Head & Neck  Lymphatics: Bilateral: Description- No Localized lymphadenopathy.        Assessment & Plan:

## 2014-09-14 NOTE — Assessment & Plan Note (Signed)
You appear to have bronchitis and sinusitis. Rest hydrate and tylenol for fever. I am prescribing benzonatate cough medicine, and augmentin antibiotic. For your nasal congestion you could use otc the counter nasal steroid.

## 2014-09-14 NOTE — Assessment & Plan Note (Signed)
For your wheezing I am prescribing qvar inhaler and albuterol inhaler.   With recent fever and wheezing I do want to get a chest xray.

## 2014-09-14 NOTE — Patient Instructions (Addendum)
You appear to have bronchitis and sinusitis. Rest hydrate and tylenol for fever. I am prescribing benzonatate cough medicine, and augmentin antibiotic. For your nasal congestion you could use otc the counter nasal steroid.   For your wheezing I am prescribing qvar inhaler and albuterol inhaler.   With recent fever and wheezing I do want to get a chest xray.  Follow up in 7-10 days or as needed

## 2014-09-14 NOTE — Progress Notes (Signed)
Pre visit review using our clinic review tool, if applicable. No additional management support is needed unless otherwise documented below in the visit note. 

## 2015-04-07 ENCOUNTER — Encounter: Payer: Self-pay | Admitting: Internal Medicine

## 2015-04-07 ENCOUNTER — Ambulatory Visit (INDEPENDENT_AMBULATORY_CARE_PROVIDER_SITE_OTHER): Payer: No Typology Code available for payment source | Admitting: Internal Medicine

## 2015-04-07 VITALS — BP 120/64 | HR 84 | Temp 98.4°F | Ht 61.5 in | Wt 223.5 lb

## 2015-04-07 DIAGNOSIS — E282 Polycystic ovarian syndrome: Secondary | ICD-10-CM

## 2015-04-07 DIAGNOSIS — I889 Nonspecific lymphadenitis, unspecified: Secondary | ICD-10-CM

## 2015-04-07 MED ORDER — AMOXICILLIN 500 MG PO CAPS: 1000.0000 mg | ORAL_CAPSULE | Freq: Two times a day (BID) | ORAL | Status: DC

## 2015-04-07 NOTE — Progress Notes (Signed)
Subjective:    Patient ID: Anita MoralesKaren Y Marengo, female    DOB: 03/29/1981, 34 y.o.   MRN: [id number]  DOS:  04/07/2015 Type of visit - description : Acute visit Interval history: Noted swelling and pain at the left side of the jaw approximately 3 days ago. 2 days ago went to urgent care, no diagnosis was made, was recommended observation. The pain and swelling kept going on, pain  was intense this morning, took ibuprofen and this afternoon feels better.    Review of Systems  denies any fever chills. No sore throat No sinus pain or congestion. No rash No dental pain.   Past Medical History  Diagnosis Date  . History of chicken pox   . Hay fever     with allergies  . Frequent UTI   . Anemia   . Anxiety   . Acute bronchitis 12/27/2011    Past Surgical History  Procedure Laterality Date  . No past surgeries      denies surgical history    History   Social History  . Marital Status: Married    Spouse Name: N/A  . Number of Children: N/A  . Years of Education: N/A   Occupational History  . Not on file.   Social History Main Topics  . Smoking status: Current Every Day Smoker -- 0.50 packs/day    Types: Cigarettes  . Smokeless tobacco: Not on file  . Alcohol Use: No  . Drug Use: No  . Sexual Activity: Not on file   Other Topics Concern  . Not on file   Social History Narrative        Medication List       This list is accurate as of: 04/07/15  5:44 PM.  Always use your most recent med list.               amoxicillin 500 MG capsule  Commonly known as:  AMOXIL  Take 2 capsules (1,000 mg total) by mouth 2 (two) times daily.     clonazePAM 0.5 MG tablet  Commonly known as:  KLONOPIN  Take 0.5 mg by mouth 2 (two) times daily as needed.     metFORMIN 500 MG 24 hr tablet  Commonly known as:  GLUCOPHAGE-XR  Take 500 mg by mouth daily with breakfast.     VIORELE 0.15-0.02/0.01 MG (21/5) tablet  Generic drug:  desogestrel-ethinyl estradiol  Take 1 tablet  by mouth daily.           Objective:   Physical Exam BP 120/64 mmHg  Pulse 84  Temp(Src) 98.4 F (36.9 C) (Oral)  Ht 5' 1.5" (1.562 m)  Wt 223 lb 8 oz (101.379 kg)  BMI 41.55 kg/m2  SpO2 97%  LMP 01/06/2015 General:   Well developed, well nourished . NAD.  HEENT:  Normocephalic . Face symmetric, atraumatic TMs normal Throat symmetric, palpation of the gum normal, no pain upon percussion of the teeth. Floor of the mouth is soft. Neck: Left submandibular   gland (salivary?) seems slightly larger and mildly tender to palpation compared to the right side. Parotid glands normal bilaterally  Skin: Not pale. Not jaundice Neurologic:  alert & oriented X3.  Speech normal, gait appropriate for age and unassisted Psych--  Cognition and judgment appear intact.  Cooperative with normal attention span and concentration.  Behavior appropriate. No anxious or depressed appearing.      Assessment & Plan:   Partially resolved lymphadenitis? Salivary gland infection? Plan: Empiric antibiotics, see instructions.

## 2015-04-07 NOTE — Patient Instructions (Signed)
Take the antibiotics as prescribed Drink plenty of fluids OTC ibuprofen as needed Call if not gradually back to normal in few days and call if the swelling increases.

## 2015-04-07 NOTE — Progress Notes (Signed)
Pre visit review using our clinic review tool, if applicable. No additional management support is needed unless otherwise documented below in the visit note. 

## 2016-02-17 LAB — HM PAP SMEAR

## 2016-07-11 ENCOUNTER — Ambulatory Visit (HOSPITAL_BASED_OUTPATIENT_CLINIC_OR_DEPARTMENT_OTHER)
Admission: RE | Admit: 2016-07-11 | Discharge: 2016-07-11 | Disposition: A | Discharge status: Home / Self Care | Payer: Managed Care, Other (non HMO) | Source: Ambulatory Visit | Attending: Family | Admitting: Family

## 2016-07-11 ENCOUNTER — Telehealth: Payer: Self-pay | Admitting: Family

## 2016-07-11 ENCOUNTER — Ambulatory Visit (INDEPENDENT_AMBULATORY_CARE_PROVIDER_SITE_OTHER): Payer: Managed Care, Other (non HMO) | Admitting: Family

## 2016-07-11 ENCOUNTER — Encounter: Payer: Self-pay | Admitting: Family

## 2016-07-11 VITALS — BP 157/86 | HR 83 | Temp 98.3°F | Resp 16 | Ht 61.0 in | Wt 216.2 lb

## 2016-07-11 DIAGNOSIS — N200 Calculus of kidney: Secondary | ICD-10-CM

## 2016-07-11 DIAGNOSIS — M545 Low back pain, unspecified: Secondary | ICD-10-CM

## 2016-07-11 DIAGNOSIS — R109 Unspecified abdominal pain: Secondary | ICD-10-CM

## 2016-07-11 DIAGNOSIS — N132 Hydronephrosis with renal and ureteral calculous obstruction: Secondary | ICD-10-CM | POA: Insufficient documentation

## 2016-07-11 DIAGNOSIS — N138 Other obstructive and reflux uropathy: Secondary | ICD-10-CM

## 2016-07-11 LAB — CBC WITH DIFFERENTIAL/PLATELET
BASOS PCT: 0.5 % (ref 0.0–3.0)
Basophils Absolute: 0.1 10*3/uL (ref 0.0–0.1)
EOS PCT: 0.4 % (ref 0.0–5.0)
Eosinophils Absolute: 0.1 10*3/uL (ref 0.0–0.7)
HCT: 41.4 % (ref 36.0–46.0)
Hemoglobin: 14.2 g/dL (ref 12.0–15.0)
LYMPHS ABS: 2.5 10*3/uL (ref 0.7–4.0)
Lymphocytes Relative: 13.9 % (ref 12.0–46.0)
MCHC: 34.3 g/dL (ref 30.0–36.0)
MCV: 85.9 fl (ref 78.0–100.0)
MONO ABS: 0.7 10*3/uL (ref 0.1–1.0)
Monocytes Relative: 4.1 % (ref 3.0–12.0)
NEUTROS PCT: 81.1 % — AB (ref 43.0–77.0)
Neutro Abs: 14.3 10*3/uL — ABNORMAL HIGH (ref 1.4–7.7)
Platelets: 386 10*3/uL (ref 150.0–400.0)
RBC: 4.82 Mil/uL (ref 3.87–5.11)
RDW: 12.7 % (ref 11.5–15.5)
WBC: 17.6 10*3/uL — ABNORMAL HIGH (ref 4.0–10.5)

## 2016-07-11 LAB — COMPREHENSIVE METABOLIC PANEL
ALK PHOS: 55 U/L (ref 39–117)
ALT: 22 U/L (ref 0–35)
AST: 20 U/L (ref 0–37)
Albumin: 4.3 g/dL (ref 3.5–5.2)
BUN: 16 mg/dL (ref 6–23)
CHLORIDE: 103 meq/L (ref 96–112)
CO2: 24 mEq/L (ref 19–32)
Calcium: 10 mg/dL (ref 8.4–10.5)
Creatinine, Ser: 0.98 mg/dL (ref 0.40–1.20)
GFR: 68.04 mL/min (ref >60.00)
GLUCOSE: 110 mg/dL — AB (ref 70–99)
POTASSIUM: 4.2 meq/L (ref 3.5–5.1)
SODIUM: 137 meq/L (ref 135–145)
TOTAL PROTEIN: 7.9 g/dL (ref 6.0–8.3)
Total Bilirubin: 0.3 mg/dL (ref 0.2–1.2)

## 2016-07-11 LAB — POCT URINALYSIS DIPSTICK
Bilirubin, UA: NEGATIVE
GLUCOSE UA: NEGATIVE
Ketones, UA: NEGATIVE
LEUKOCYTES UA: NEGATIVE
NITRITE UA: NEGATIVE
Spec Grav, UA: >=1.03
UROBILINOGEN UA: NEGATIVE
pH, UA: 6

## 2016-07-11 LAB — POCT URINE PREGNANCY: PREG TEST UR: NEGATIVE

## 2016-07-11 MED ORDER — TAMSULOSIN HCL 0.4 MG PO CAPS: 0.4000 mg | ORAL_CAPSULE | Freq: Every day | ORAL | 0 refills | Status: DC

## 2016-07-11 MED ORDER — CIPROFLOXACIN HCL 500 MG PO TABS: 500.0000 mg | ORAL_TABLET | Freq: Two times a day (BID) | ORAL | 0 refills | Status: AC

## 2016-07-11 MED ORDER — HYDROCODONE-ACETAMINOPHEN 5-325 MG PO TABS: 1.0000 | ORAL_TABLET | Freq: Four times a day (QID) | ORAL | 0 refills | Status: DC | PRN

## 2016-07-11 NOTE — Telephone Encounter (Signed)
Urine strainer left at front dest for patient to pick up. Patient advised.

## 2016-07-11 NOTE — Patient Instructions (Signed)
Please begin cipro for possible urinary tract infection. You may use hydrocodone as needed for pain- but use caution as this medication may cause drowsiness.

## 2016-07-11 NOTE — Progress Notes (Signed)
Pre visit review using our clinic review tool, if applicable. No additional management support is needed unless otherwise documented below in the visit note. 

## 2016-07-11 NOTE — Telephone Encounter (Signed)
Reviewed results of CT scan with pt. Advised pt to strain urine, start flomax, cipro, and will arrange a urology referral. Pt verbalizes understanding. [name], do we have a strainer we could leave for the patient at the front desk please?

## 2016-07-11 NOTE — Progress Notes (Signed)
Subjective:    Patient ID: [name], female    DOB: 1981-04-29, 35 y.o.   MRN: [id number]  HPI  Ms. Scarbrough is a 35 yr old female who presents today with chief complaint of left sided low back pain. Pain is located on the left side, low back. Constant. Pain is 8.5/10.  Denies fever.  + nausea, vomitted x 1 this AM.  Having trouble getting comfortable.  + Dysuria and + frequency. + diarrhea.    Review of Systems    see HPI  Past Medical History:  Diagnosis Date  . Acute bronchitis 12/27/2011  . Anemia   . Anxiety   . Frequent UTI   . Hay fever    with allergies  . History of chicken pox      Social History   Social History  . Marital status: Married    Spouse name: N/A  . Number of children: N/A  . Years of education: N/A   Occupational History  . Not on file.   Social History Main Topics  . Smoking status: Current Every Day Smoker    Packs/day: 1.00    Types: Cigarettes  . Smokeless tobacco: Not on file  . Alcohol use No  . Drug use: No  . Sexual activity: No   Other Topics Concern  . Not on file   Social History Narrative  . No narrative on file    Past Surgical History:  Procedure Laterality Date  . NO PAST SURGERIES     denies surgical history    Family History  Problem Relation Age of Onset  . Arthritis Mother   . Diabetes      grandmother  . Diabetes Paternal Grandfather   . Diabetes Paternal Grandmother     No Known Allergies  Current Outpatient Prescriptions on File Prior to Visit  Medication Sig Dispense Refill  . clonazePAM (KLONOPIN) 0.5 MG tablet Take 0.5 mg by mouth 2 (two) times daily as needed.      . metFORMIN (GLUCOPHAGE-XR) 500 MG 24 hr tablet Take 500 mg by mouth daily with breakfast.      No current facility-administered medications on file prior to visit.     BP (!) 157/86 (BP Location: Right Arm, Patient Position: Sitting, Cuff Size: Large)   Pulse 83   Temp 98.3 F (36.8 C) (Oral)   Resp 16   Ht 5\' 1"  (1.549  m)   Wt 216 lb 3.2 oz (98.1 kg)   LMP 07/10/2016   SpO2 99% Comment: room air  BMI 40.85 kg/m    Objective:   Physical Exam  Constitutional: She appears well-developed and well-nourished.  Cardiovascular: Normal rate, regular rhythm and normal heart sounds.   No murmur heard. Pulmonary/Chest: Effort normal and breath sounds normal. No respiratory distress. She has no wheezes.  Abdominal: Soft. There is tenderness in the suprapubic area, left upper quadrant and left lower quadrant. There is no guarding and no CVA tenderness.  Psychiatric: She has a normal mood and affect. Her behavior is normal. Judgment and thought content normal.          Assessment & Plan:  Kidney stone- UA + blood. CT is performed and reviewed:   Obstructing 7 mm left ureterovesical junction stone with mild tomoderate left hydroureteronephrosis is noted. Will add flomax, rx with empiric cipro, refer to urology for further evaluation. Hydrocodone prn pain. Advised pt to go to strain urine, go to ER if new/worsening symptoms. Pt verbalizes understanding.  Blood  work is pending.

## 2016-07-13 LAB — URINE CULTURE

## 2016-07-14 ENCOUNTER — Other Ambulatory Visit: Payer: Self-pay | Admitting: Urology

## 2016-07-17 ENCOUNTER — Encounter (HOSPITAL_COMMUNITY): Payer: Self-pay | Admitting: *Deleted

## 2016-07-18 ENCOUNTER — Encounter (HOSPITAL_COMMUNITY): Payer: Self-pay | Admitting: *Deleted

## 2016-07-19 ENCOUNTER — Encounter (HOSPITAL_COMMUNITY): Payer: Self-pay | Admitting: *Deleted

## 2016-07-20 ENCOUNTER — Ambulatory Visit (HOSPITAL_COMMUNITY): Admission: RE | Admit: 2016-07-20 | Payer: Managed Care, Other (non HMO) | Source: Ambulatory Visit | Admitting: Urology

## 2016-07-20 HISTORY — DX: Personal history of urinary calculi: Z87.442

## 2016-07-20 HISTORY — DX: Prediabetes: R73.03

## 2016-07-20 SURGERY — LITHOTRIPSY, ESWL
Anesthesia: LOCAL | Laterality: Left

## 2016-07-21 ENCOUNTER — Ambulatory Visit: Payer: Managed Care, Other (non HMO) | Admitting: Family

## 2018-07-01 ENCOUNTER — Ambulatory Visit: Payer: Self-pay

## 2018-07-01 ENCOUNTER — Encounter: Payer: Self-pay | Admitting: Medical

## 2018-07-01 ENCOUNTER — Other Ambulatory Visit: Payer: Self-pay | Admitting: Medical

## 2018-07-01 ENCOUNTER — Ambulatory Visit (INDEPENDENT_AMBULATORY_CARE_PROVIDER_SITE_OTHER): Payer: Managed Care, Other (non HMO) | Admitting: Medical

## 2018-07-01 VITALS — BP 125/69 | HR 76 | Temp 98.3°F | Resp 16 | Ht 61.0 in | Wt 193.4 lb

## 2018-07-01 DIAGNOSIS — R05 Cough: Secondary | ICD-10-CM | POA: Diagnosis not present

## 2018-07-01 DIAGNOSIS — R062 Wheezing: Secondary | ICD-10-CM | POA: Diagnosis not present

## 2018-07-01 DIAGNOSIS — J0100 Acute maxillary sinusitis, unspecified: Secondary | ICD-10-CM

## 2018-07-01 DIAGNOSIS — J40 Bronchitis, not specified as acute or chronic: Secondary | ICD-10-CM | POA: Diagnosis not present

## 2018-07-01 DIAGNOSIS — R059 Cough, unspecified: Secondary | ICD-10-CM

## 2018-07-01 MED ORDER — BENZONATATE 100 MG PO CAPS: 100.0000 mg | ORAL_CAPSULE | Freq: Three times a day (TID) | ORAL | 0 refills | Status: DC | PRN

## 2018-07-01 MED ORDER — FLUTICASONE PROPIONATE 50 MCG/ACT NA SUSP: 2.0000 | Freq: Every day | NASAL | 1 refills | Status: DC

## 2018-07-01 MED ORDER — ALBUTEROL SULFATE HFA 108 (90 BASE) MCG/ACT IN AERS: 2.0000 | INHALATION_SPRAY | Freq: Four times a day (QID) | RESPIRATORY_TRACT | 0 refills | Status: DC | PRN

## 2018-07-01 MED ORDER — AZITHROMYCIN 250 MG PO TABS: ORAL_TABLET | ORAL | 0 refills | Status: DC

## 2018-07-01 NOTE — Telephone Encounter (Signed)
Pt. called to report onset of sinus pressure on Friday, with progression of symptoms, on Saturday, to runny nose, productive cough, intermittent wheeze, and tightness in mid chest.  Denied any fever/ chills; c/o feeling warm and having sweats at intervals.  Denied any shortness of breath.  Reported the chest tightness is located in the sternal area and started with her other symptoms.  Denied any radiation of the chest pain to neck, jaw, or shoulder.  Stated had clear, thin nasal drainage that has changed to thick and yellow.  Reported intermittent blood streaks in nasal drainage, when she blows her nose.  Reported she continues to feel sinus pressure.  Stated has intermittent wheeze. Reported coughing up yellow mucus intermittently.  Reported taking Ibuprofen and Mucinex for above symptoms.  Has taken the day off from work, and is requesting an appt.  Given appt. At 1:40 PM at PCP office.  Pt. Advised to call back or go to ER if has shortness of breath or increase in chest tightness.  Verb. Understanding.  Agreed with plan.        Reason for Disposition . Wheezing is present  Answer Assessment - Initial Assessment Questions 1. ONSET: "When did the cough begin?"      Saturday 2. SEVERITY: "How bad is the cough today?"      Occasional  3. RESPIRATORY DISTRESS: "Describe your breathing."      Denied shortness of breath 4. FEVER: "Do you have a fever?" If so, ask: "What is your temperature, how was it measured, and when did it start?"     Some feeling of hot and sweating 5. SPUTUM: "Describe the color of your sputum" (clear, white, yellow, green)     Yellow 6. HEMOPTYSIS: "Are you coughing up any blood?" If so ask: "How much?" (flecks, streaks, tablespoons, etc.)     Denied blood in sputum 7. CARDIAC HISTORY: "Do you have any history of heart disease?" (e.g., heart attack, congestive heart failure)      denied 8. LUNG HISTORY: "Do you have any history of lung disease?"  (e.g., pulmonary embolus,  asthma, emphysema)     Denied the above 9. PE RISK FACTORS: "Do you have a history of blood clots?" (or: recent major surgery, recent prolonged travel, bedridden)     Not asked 10. OTHER SYMPTOMS: "Do you have any other symptoms?" (e.g., runny nose, wheezing, chest pain)       Started feeling sinus pressure in the brow area on Friday; runny nose was clear mucus and occas. Blood streaks and now is yellow, intermittent wheeze, a little tightness in mid chest since onset of symptoms  11. PREGNANCY: "Is there any chance you are pregnant?" "When was your last menstrual period?"       LMP last week 12. TRAVEL: "Have you traveled out of the country in the last month?" (e.g., travel history, exposures)       denied  Protocols used: COUGH - ACUTE PRODUCTIVE-A-AH

## 2018-07-01 NOTE — Progress Notes (Signed)
Subjective:    Patient ID: Anita Cox, female    DOB: [birth year], 37 y.o.   MRN: [id number]  HPI  Pt in for recent sinus pressure and runny nose since Friday. Worse on Saturday. Pt states clear drainage from nose at first then started to turn yellow. Maxillary sinus hurt worse. Some productive cough. Rare transient wheeze after cough.  LMP- ended on Friday.  Some sneezing over the weekend with some pnd early on.  Pt is on zyrtec daily. Hx of allergies year round.    Review of Systems  Constitutional: Negative for chills, fatigue and fever.  HENT: Positive for congestion, postnasal drip, sinus pressure, sinus pain and sneezing. Negative for trouble swallowing.   Respiratory: Positive for wheezing. Negative for cough, chest tightness and shortness of breath.        Rare wheeze  Cardiovascular: Negative for chest pain and palpitations.  Gastrointestinal: Negative for abdominal pain.  Genitourinary: Negative for dyspareunia, dysuria, enuresis, frequency, hematuria and urgency.  Musculoskeletal: Negative for back pain.  Neurological: Negative for dizziness, seizures, weakness and light-headedness.  Hematological: Negative for adenopathy. Does not bruise/bleed easily.  Psychiatric/Behavioral: Negative for behavioral problems and confusion.   Past Medical History:  Diagnosis Date  . Acute bronchitis 12/27/2011  . Anemia   . Anxiety   . Frequent UTI   . Hay fever    with allergies  . History of chicken pox   . History of kidney stones    07/2016  . Pre-diabetes      Social History   Socioeconomic History  . Marital status: Married    Spouse name: Not on file  . Number of children: Not on file  . Years of education: Not on file  . Highest education level: Not on file  Occupational History  . Not on file  Social Needs  . Financial resource strain: Not on file  . Food insecurity:    Worry: Not on file    Inability: Not on file  . Transportation needs:    Medical:  Not on file    Non-medical: Not on file  Tobacco Use  . Smoking status: Current Every Day Smoker    Packs/day: 1.00    Types: Cigarettes  . Smokeless tobacco: Never Used  Substance and Sexual Activity  . Alcohol use: No  . Drug use: No  . Sexual activity: Never  Lifestyle  . Physical activity:    Days per week: Not on file    Minutes per session: Not on file  . Stress: Not on file  Relationships  . Social connections:    Talks on phone: Not on file    Gets together: Not on file    Attends religious service: Not on file    Active member of club or organization: Not on file    Attends meetings of clubs or organizations: Not on file    Relationship status: Not on file  . Intimate partner violence:    Fear of current or ex partner: Not on file    Emotionally abused: Not on file    Physically abused: Not on file    Forced sexual activity: Not on file  Other Topics Concern  . Not on file  Social History Narrative  . Not on file    Past Surgical History:  Procedure Laterality Date  . NO PAST SURGERIES     denies surgical history    Family History  Problem Relation Age of Onset  . Arthritis Mother   .  Diabetes Unknown        grandmother  . Diabetes Paternal Grandfather   . Diabetes Paternal Grandmother     No Known Allergies  Current Outpatient Medications on File Prior to Visit  Medication Sig Dispense Refill  . cetirizine (ZYRTEC) 10 MG tablet Take 10 mg by mouth daily.    . clonazePAM (KLONOPIN) 0.5 MG tablet Take 0.5 mg by mouth 2 (two) times daily as needed.      . metFORMIN (GLUCOPHAGE-XR) 500 MG 24 hr tablet Take 500 mg by mouth daily with breakfast.     . TRI-PREVIFEM 0.18/0.215/0.25 MG-35 MCG tablet Take 1 tablet by mouth daily.  11   No current facility-administered medications on file prior to visit.     BP 125/69   Pulse 76   Temp 98.3 F (36.8 C) (Oral)   Resp 16   Ht 5\' 1"  (1.549 m)   Wt 193 lb 6.4 oz (87.7 kg)   SpO2 99%   BMI 36.54 kg/m         Objective:   Physical Exam   General  Mental Status - Alert. General Appearance - Well groomed. Not in acute distress.  Skin Rashes- No Rashes.  HEENT Head- Normal. Ear Auditory Canal - Left- Normal. Right - Normal.Tympanic Membrane- Left- Normal. Right- Normal. Eye Sclera/Conjunctiva- Left- Normal. Right- Normal. Nose & Sinuses Nasal Mucosa- Left-  Boggy and Congested. Right-  Boggy and  Congested.Bilateral maxillary but no  frontal sinus pressure. Mouth & Throat Lips: Upper Lip- Normal: no dryness, cracking, pallor, cyanosis, or vesicular eruption. Lower Lip-Normal: no dryness, cracking, pallor, cyanosis or vesicular eruption. Buccal Mucosa- Bilateral- No Aphthous ulcers. Oropharynx- No Discharge or Erythema. Tonsils: Characteristics- Bilateral- No Erythema or Congestion. Size/Enlargement- Bilateral- No enlargement. Discharge- bilateral-None.  Neck Neck- Supple. No Masses.   Chest and Lung Exam Auscultation: Breath Sounds:-even and unlabored. Faint occasional expiratory wheeze.  Cardiovascular Auscultation:Rythm- Regular, rate and rhythm. Murmurs & Other Heart Sounds:Ausculatation of the heart reveal- No Murmurs.  Lymphatic Head & Neck General Head & Neck Lymphatics: Bilateral: Description- No Localized lymphadenopathy.     Assessment & Plan:   You appear to have bronchitis and sinusitis with allergy component. Rest hydrate and tylenol for fever. I am prescribing cough medicine benzonatate, and azithromycin antibiotic. For your nasal congestion rx flonase  You should gradually get better. If not then notify us and would recommend a chest xray.  For mild wheeze presently making albuterol inhaler available.  Follow up in 7-10 days or as needed  [employer], [employer]

## 2018-07-01 NOTE — Patient Instructions (Addendum)
You appear to have bronchitis and sinusitis with allergy component. Rest hydrate and tylenol for fever. I am prescribing cough medicine benzonatate, and azithromycin antibiotic. For your nasal congestion rx flonase  You should gradually get better. If not then notify us and would recommend a chest xray.  For mild wheeze presently making albuterol inhaler available.  Follow up in 7-10 days or as needed

## 2018-07-03 ENCOUNTER — Other Ambulatory Visit: Payer: Self-pay | Admitting: Medical

## 2018-10-23 ENCOUNTER — Ambulatory Visit: Payer: Self-pay | Admitting: *Deleted

## 2018-10-23 NOTE — Telephone Encounter (Signed)
Pt initially calling to report nausea, onset this AM.  During triage process, pt reporting multiple symptoms.  States onset of nausea this am, no vomiting. Has been able to eat as usual, staying hydrated. No diarrhea. Also reports sinus tenderness, intermittent lightheadedness, "Mild" sore throat, post nasal drainage. Reports "Mild body aches." States unsure if febrile but has chills alternating with diaphoresis. Pt states several of her coworkers have recently been diagnosed with the flu. Reports increased fatigue today. Appt made with E. Saguier for tomorrow; care advise given per protocol. Pt verbalizes understanding.   Reason for Disposition . Fever present > 3 days (72 hours)    "Feels hot" onset this AM; but reports multiple symptoms, around co- workers with flu.  Answer Assessment - Initial Assessment Questions 1. NAUSEA SEVERITY: "How bad is the nausea?" (e.g., mild, moderate, severe; dehydration, weight loss)   - MILD: loss of appetite without change in eating habits   - MODERATE: decreased oral intake without significant weight loss, dehydration, or malnutrition   - SEVERE: inadequate caloric or fluid intake, significant weight loss, symptoms of dehydration     mild 2. ONSET: "When did the nausea begin?"     This evening 3. VOMITING: "Any vomiting?" If so, ask: "How many times today?"     no 4. RECURRENT SYMPTOM: "Have you had nausea before?" If so, ask: "When was the last time?" "What happened that time?"     no 5. CAUSE: "What do you think is causing the nausea?"     Unsure 6. PREGNANCY: "Is there any chance you are pregnant?" (e.g., unprotected intercourse, missed birth control pill, broken condom)    no  Protocols used: NAUSEA-A-AH

## 2018-10-24 ENCOUNTER — Ambulatory Visit: Payer: Managed Care, Other (non HMO) | Admitting: Medical

## 2019-01-15 ENCOUNTER — Telehealth: Payer: Self-pay | Admitting: *Deleted

## 2019-01-15 NOTE — Telephone Encounter (Signed)
Left detailed message for pt to call and verify PCP. If she is still seeing Melissa for PCP she needs CPE scheduled some time in July.  If she has any acute concerns at this time could discuss virtual visit and schedule if appropriate. If she is no longer seeing Melissa for PCP, please update PCP status in Epic.

## 2019-07-01 ENCOUNTER — Telehealth: Payer: Managed Care, Other (non HMO) | Admitting: Nurse Practitioner

## 2019-07-01 DIAGNOSIS — J0100 Acute maxillary sinusitis, unspecified: Secondary | ICD-10-CM

## 2019-07-01 MED ORDER — AMOXICILLIN-POT CLAVULANATE 875-125 MG PO TABS: 1.0000 | ORAL_TABLET | Freq: Two times a day (BID) | ORAL | 0 refills | Status: DC

## 2019-07-01 NOTE — Progress Notes (Signed)

## 2019-11-28 LAB — HM PAP SMEAR: HM Pap smear: NEGATIVE

## 2020-08-27 ENCOUNTER — Other Ambulatory Visit: Payer: Self-pay

## 2020-08-27 ENCOUNTER — Ambulatory Visit (INDEPENDENT_AMBULATORY_CARE_PROVIDER_SITE_OTHER): Payer: Managed Care, Other (non HMO) | Admitting: Medical

## 2020-08-27 VITALS — BP 138/84 | HR 93 | Temp 98.2°F | Resp 18 | Ht 61.0 in | Wt 208.0 lb

## 2020-08-27 DIAGNOSIS — L723 Sebaceous cyst: Secondary | ICD-10-CM | POA: Diagnosis not present

## 2020-08-27 DIAGNOSIS — Z23 Encounter for immunization: Secondary | ICD-10-CM

## 2020-08-27 MED ORDER — DOXYCYCLINE HYCLATE 100 MG PO TABS: 100.0000 mg | ORAL_TABLET | Freq: Two times a day (BID) | ORAL | 0 refills | Status: DC

## 2020-08-27 NOTE — Patient Instructions (Signed)
You do appear to have sebaceous cyst with possible infection. Rx doxycycline twice daily for 10 days. Apply warm compress twice daily.  Give me my chart update on Monday.  Might refer to dermatologist for cyst removal attempt.  Follow up 7 days or as needed

## 2020-08-27 NOTE — Progress Notes (Signed)
Subjective:    Patient ID: Anita Cox, female    DOB: 1981/01/29, 39 y.o.   MRN: [id number]  HPI  Pt in for a lump that pt states came up on her back. Pt states sometimes has some irritation to area.   Pt states noticed around thankgiving. For 2 days it hurts. Then pain decreased. Not itches intermittently with pain. Then wed and Thursday this week pain again.   lmp- last Friday     Review of Systems  Constitutional: Negative for chills, fatigue and fever.  Respiratory: Negative for cough, chest tightness, shortness of breath and wheezing.   Cardiovascular: Negative for chest pain and palpitations.  Gastrointestinal: Negative for abdominal pain.  Musculoskeletal: Negative for back pain.  Skin:       Rt side swollen area upper back.  Neurological: Negative for dizziness and headaches.  Hematological: Negative for adenopathy. Does not bruise/bleed easily.  Psychiatric/Behavioral: Negative for behavioral problems.    Past Medical History:  Diagnosis Date  . Acute bronchitis 12/27/2011  . Anemia   . Anxiety   . Frequent UTI   . Hay fever    with allergies  . History of chicken pox   . History of kidney stones    07/2016  . Pre-diabetes      Social History   Socioeconomic History  . Marital status: Married    Spouse name: Not on file  . Number of children: Not on file  . Years of education: Not on file  . Highest education level: Not on file  Occupational History  . Not on file  Tobacco Use  . Smoking status: Current Every Day Smoker    Packs/day: 1.00    Types: Cigarettes  . Smokeless tobacco: Never Used  Substance and Sexual Activity  . Alcohol use: No  . Drug use: No  . Sexual activity: Never  Other Topics Concern  . Not on file  Social History Narrative  . Not on file   Social Determinants of Health   Financial Resource Strain: Not on file  Food Insecurity: Not on file  Transportation Needs: Not on file  Physical Activity: Not on file   Stress: Not on file  Social Connections: Not on file  Intimate Partner Violence: Not on file    Past Surgical History:  Procedure Laterality Date  . NO PAST SURGERIES     denies surgical history    Family History  Problem Relation Age of Onset  . Arthritis Mother   . Diabetes Unknown        grandmother  . Diabetes Paternal Grandfather   . Diabetes Paternal Grandmother     No Known Allergies  Current Outpatient Medications on File Prior to Visit  Medication Sig Dispense Refill  . clonazePAM (KLONOPIN) 0.5 MG tablet Take 0.5 mg by mouth 2 (two) times daily as needed.    . metFORMIN (GLUCOPHAGE-XR) 500 MG 24 hr tablet Take 500 mg by mouth daily with breakfast.     . TRI-PREVIFEM 0.18/0.215/0.25 MG-35 MCG tablet Take 1 tablet by mouth daily.  11  . albuterol (PROVENTIL HFA;VENTOLIN HFA) 108 (90 Base) MCG/ACT inhaler Inhale 2 puffs into the lungs every 6 (six) hours as needed for wheezing or shortness of breath. 1 Inhaler 0  . benzonatate (TESSALON) 100 MG capsule Take 1 capsule (100 mg total) by mouth 3 (three) times daily as needed for cough. 30 capsule 0  . cetirizine (ZYRTEC) 10 MG tablet Take 10 mg by mouth daily.    Marland Kitchen  fluticasone (FLONASE) 50 MCG/ACT nasal spray Place 2 sprays into both nostrils daily. 16 g 1   No current facility-administered medications on file prior to visit.    BP 138/84   Pulse 93   Temp 98.2 F (36.8 C)   Resp 18   Ht 5\' 1"  (1.549 m)   Wt 208 lb (94.3 kg)   LMP 08/20/2020   SpO2 98%   BMI 39.30 kg/m       Objective:   Physical Exam   General- No acute distress. Pleasant patient. Neck- Full range of motion, no jvd Lungs- Clear, even and unlabored. Heart- regular rate and rhythm. Neurologic- CNII- XII grossly intact. Skin- rt side lower trapezius area 2.0 cm approximate sized sebacious cyst. Mild tender. No fluctuance.     Assessment & Plan:  You do appear to have sebaceous cyst with possible infection. Rx doxycycline twice daily  for 10 days. Apply warm compress twice daily.  Give me my chart update on Monday.  Might refer to dermatologist for cyst removal attempt.  Follow up 7 days or as needed    Wednesday, [employer]

## 2020-09-28 ENCOUNTER — Ambulatory Visit (INDEPENDENT_AMBULATORY_CARE_PROVIDER_SITE_OTHER): Payer: Managed Care, Other (non HMO) | Admitting: Family

## 2020-09-28 ENCOUNTER — Encounter: Payer: Self-pay | Admitting: Family

## 2020-09-28 ENCOUNTER — Telehealth: Payer: Self-pay | Admitting: Family

## 2020-09-28 ENCOUNTER — Other Ambulatory Visit: Payer: Self-pay

## 2020-09-28 VITALS — BP 131/74 | HR 81 | Temp 98.5°F | Resp 14 | Ht 61.0 in | Wt 211.4 lb

## 2020-09-28 DIAGNOSIS — R635 Abnormal weight gain: Secondary | ICD-10-CM

## 2020-09-28 DIAGNOSIS — Z0000 Encounter for general adult medical examination without abnormal findings: Secondary | ICD-10-CM

## 2020-09-28 DIAGNOSIS — J329 Chronic sinusitis, unspecified: Secondary | ICD-10-CM

## 2020-09-28 DIAGNOSIS — R739 Hyperglycemia, unspecified: Secondary | ICD-10-CM

## 2020-09-28 DIAGNOSIS — D72829 Elevated white blood cell count, unspecified: Secondary | ICD-10-CM

## 2020-09-28 DIAGNOSIS — Z23 Encounter for immunization: Secondary | ICD-10-CM

## 2020-09-28 LAB — CBC WITH DIFFERENTIAL/PLATELET
Basophils Absolute: 0.1 10*3/uL (ref 0.0–0.1)
Basophils Relative: 0.6 % (ref 0.0–3.0)
Eosinophils Absolute: 0.3 10*3/uL (ref 0.0–0.7)
Eosinophils Relative: 2.2 % (ref 0.0–5.0)
HCT: 41.1 % (ref 36.0–46.0)
Hemoglobin: 13.9 g/dL (ref 12.0–15.0)
Lymphocytes Relative: 30 % (ref 12.0–46.0)
Lymphs Abs: 3.7 10*3/uL (ref 0.7–4.0)
MCHC: 33.9 g/dL (ref 30.0–36.0)
MCV: 90.3 fl (ref 78.0–100.0)
Monocytes Absolute: 0.8 10*3/uL (ref 0.1–1.0)
Monocytes Relative: 6.4 % (ref 3.0–12.0)
Neutro Abs: 7.5 10*3/uL (ref 1.4–7.7)
Neutrophils Relative %: 60.8 % (ref 43.0–77.0)
Platelets: 295 10*3/uL (ref 150.0–400.0)
RBC: 4.55 Mil/uL (ref 3.87–5.11)
RDW: 12.4 % (ref 11.5–15.5)
WBC: 12.4 10*3/uL — ABNORMAL HIGH (ref 4.0–10.5)

## 2020-09-28 LAB — COMPREHENSIVE METABOLIC PANEL
ALT: 11 U/L (ref 0–35)
AST: 12 U/L (ref 0–37)
Albumin: 4.1 g/dL (ref 3.5–5.2)
Alkaline Phosphatase: 51 U/L (ref 39–117)
BUN: 9 mg/dL (ref 6–23)
CO2: 26 mEq/L (ref 19–32)
Calcium: 9.1 mg/dL (ref 8.4–10.5)
Chloride: 101 mEq/L (ref 96–112)
Creatinine, Ser: 0.66 mg/dL (ref 0.40–1.20)
GFR: 109.45 mL/min (ref >60.00)
Glucose, Bld: 84 mg/dL (ref 70–99)
Potassium: 4.2 mEq/L (ref 3.5–5.1)
Sodium: 135 mEq/L (ref 135–145)
Total Bilirubin: 0.3 mg/dL (ref 0.2–1.2)
Total Protein: 7 g/dL (ref 6.0–8.3)

## 2020-09-28 LAB — LIPID PANEL
Cholesterol: 179 mg/dL (ref 0–200)
HDL: 37.6 mg/dL — ABNORMAL LOW (ref >39.00)
NonHDL: 141.62
Total CHOL/HDL Ratio: 5
Triglycerides: 316 mg/dL — ABNORMAL HIGH (ref 0.0–149.0)
VLDL: 63.2 mg/dL — ABNORMAL HIGH (ref 0.0–40.0)

## 2020-09-28 LAB — LDL CHOLESTEROL, DIRECT: Direct LDL: 113 mg/dL

## 2020-09-28 LAB — TSH: TSH: 0.96 u[IU]/mL (ref 0.35–4.50)

## 2020-09-28 LAB — HEMOGLOBIN A1C: Hgb A1c MFr Bld: 5.7 % (ref 4.6–6.5)

## 2020-09-28 NOTE — Progress Notes (Signed)
Subjective:    Patient ID: Anita Cox, female    DOB: 30-Aug-1981, 40 y.o.   MRN: [id number]  HPI  Patient presents today for complete physical.  Immunizations: flu shot 08/27/20- Tdap due.  Diet: "needs improvement" Wt Readings from Last 3 Encounters:  09/28/20 211 lb 6.4 oz (95.9 kg)  08/27/20 208 lb (94.3 kg)  07/01/18 193 lb 6.4 oz (87.7 kg)   Breakfast- oatmeal packet or chobani yogurt drinks Lunch- if at work it is fast food, if she is at home she will eat a sandwich, broccoli Dinner- usually has a meat/vegetable/sides including vegetables Rare snacking Beverages- coffee, coke zero and water Exercise: not exercising Pap Smear: GYN- Dr. Marcelle Overlie.  Mammogram:  due Vision: due Dental: up to date 1PPD smoking a day-   Review of Systems  Constitutional: Positive for unexpected weight change.  HENT: Negative for hearing loss.        Chronic left sided sinus pain/pressure- year round.  Left nare has been bleeding with blowing.    Eyes: Negative for visual disturbance.  Respiratory: Negative for cough and shortness of breath.   Cardiovascular: Negative for chest pain.  Gastrointestinal: Negative for blood in stool, diarrhea and nausea.  Genitourinary: Negative for dysuria, frequency and hematuria.  Musculoskeletal: Negative for arthralgias and myalgias.  Skin: Negative for rash.  Neurological: Negative for headaches.  Hematological: Negative for adenopathy.  Psychiatric/Behavioral:       Denies depression Some chronic anxiety.    Past Medical History:  Diagnosis Date  . Acute bronchitis 12/27/2011  . Anemia   . Anxiety   . Frequent UTI   . Hay fever    with allergies  . History of chicken pox   . History of kidney stones    07/2016  . Pre-diabetes      Social History   Socioeconomic History  . Marital status: Married    Spouse name: Not on file  . Number of children: Not on file  . Years of education: Not on file  . Highest education level:  Not on file  Occupational History  . Not on file  Tobacco Use  . Smoking status: Current Every Day Smoker    Packs/day: 1.00    Types: Cigarettes  . Smokeless tobacco: Never Used  Substance and Sexual Activity  . Alcohol use: Yes    Comment: rare wine  . Drug use: No  . Sexual activity: Not Currently  Other Topics Concern  . 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 NO PAST SURGERIES     denies surgical history    Family History  Problem Relation Age of Onset  . Arthritis Mother   . Drug abuse Mother   . Diabetes Other        grandmother  . Diabetes Paternal Grandfather   . Diabetes Paternal Grandmother   . Lupus Half-Sister   .  OCD Half-Sister   . Bipolar disorder Half-Sister   . ADD / ADHD Half-Sister   . Anxiety disorder Half-Brother   . Irritable bowel syndrome Half-Brother   . Diabetes Mellitus II Maternal Grandmother   . Urinary tract infection Maternal Grandmother   . Alcohol abuse Maternal Grandfather     No Known Allergies  Current Outpatient Medications on File Prior to Visit  Medication Sig Dispense Refill  . cetirizine (ZYRTEC) 10 MG tablet Take 10 mg by mouth daily.    . clonazePAM (KLONOPIN) 0.5 MG tablet Take 0.5 mg by mouth 2 (two) times daily as needed.    . metFORMIN (GLUCOPHAGE-XR) 500 MG 24 hr tablet Take 500 mg by mouth daily with breakfast.     . TRI-PREVIFEM 0.18/0.215/0.25 MG-35 MCG tablet Take 1  tablet by mouth daily.  11  . albuterol (PROVENTIL HFA;VENTOLIN HFA) 108 (90 Base) MCG/ACT inhaler Inhale 2 puffs into the lungs every 6 (six) hours as needed for wheezing or shortness of breath. 1 Inhaler 0   No current facility-administered medications on file prior to visit.    BP 131/74 (BP Location: Left Arm, Patient Position: Sitting, Cuff Size: Normal)   Pulse 81   Temp 98.5 F (36.9 C) (Oral)   Resp 14   Ht 5\' 1"  (1.549 m)   Wt 211 lb 6.4 oz (95.9 kg)   SpO2 99%   BMI 39.94 kg/m       Objective:   Physical Exam  Physical Exam  Constitutional: She is oriented to person, place, and time. She appears well-developed and well-nourished. No distress.  HENT:  Head: Normocephalic and atraumatic.  Right Ear: Tympanic membrane and ear canal normal.  Left Ear: Tympanic membrane and ear canal normal.  Mouth/Throat: Not examined- pt wearing mask Eyes: Pupils are equal, round, and reactive to light. No scleral icterus.  Neck: Normal range of motion. No thyromegaly present.  Cardiovascular: Normal rate and regular rhythm.   No murmur heard. Pulmonary/Chest: Effort normal and breath sounds normal. No respiratory distress. He has no wheezes. She has no rales. She exhibits no tenderness.  Abdominal: Soft. Bowel sounds are normal. She exhibits no distension and no mass. There is no tenderness. There is no rebound and no guarding.  Musculoskeletal: She exhibits no edema.  Lymphadenopathy:    She has no cervical adenopathy.  Neurological: She is alert and oriented to person, place, and time. She has normal patellar reflexes. She exhibits normal muscle tone. Coordination normal.  Skin: Skin is warm and dry.  Psychiatric: She has a normal mood and affect. Her behavior is normal. Judgment and thought content normal.  Breasts: Examined lying Right: Without masses, retractions, discharge or axillary adenopathy.  Left: Without masses, retractions, discharge or axillary adenopathy.  Pelvic:  deferred         Assessment & Plan:  Preventative care- discussed packing a healthy well balanced meal for lunch to avoid fast food and to add some regular walking. Refer for mammogram. Will request copy of most recent Pap. Tdap today. Other vaccines reviewed and up to date.  Discussed importance of smoking cessation and various options to help her quit. She is not currently ready to quit smoking. Recommended that she schedule a routine eye exam.   This visit occurred during the SARS-CoV-2 public health emergency.  Safety protocols were in place, including screening questions prior to the visit, additional usage of staff PPE, and extensive cleaning of exam room while observing appropriate contact time as indicated for disinfecting  solutions.          Assessment & Plan:

## 2020-09-28 NOTE — Patient Instructions (Signed)
Please schedule a routine eye exam.  

## 2020-09-28 NOTE — Telephone Encounter (Signed)
Please call Dr. Lynnell Dike office and request copy of Pap.

## 2020-09-28 NOTE — Telephone Encounter (Signed)
Records release form faxed to [facility] 

## 2020-09-29 ENCOUNTER — Telehealth: Payer: Self-pay | Admitting: Family

## 2020-09-29 DIAGNOSIS — D72829 Elevated white blood cell count, unspecified: Secondary | ICD-10-CM

## 2020-09-29 NOTE — Telephone Encounter (Signed)
See mychart.  

## 2020-10-21 ENCOUNTER — Other Ambulatory Visit: Payer: Self-pay | Admitting: Family

## 2020-10-21 DIAGNOSIS — D72829 Elevated white blood cell count, unspecified: Secondary | ICD-10-CM

## 2020-10-26 ENCOUNTER — Other Ambulatory Visit: Payer: Self-pay

## 2020-10-26 ENCOUNTER — Encounter: Payer: Self-pay | Admitting: Family

## 2020-10-26 ENCOUNTER — Inpatient Hospital Stay: Payer: Managed Care, Other (non HMO) | Attending: Family

## 2020-10-26 ENCOUNTER — Inpatient Hospital Stay (HOSPITAL_BASED_OUTPATIENT_CLINIC_OR_DEPARTMENT_OTHER): Payer: Managed Care, Other (non HMO) | Admitting: Family

## 2020-10-26 ENCOUNTER — Telehealth: Payer: Self-pay | Admitting: *Deleted

## 2020-10-26 VITALS — BP 125/82 | HR 79 | Temp 98.3°F | Resp 18 | Wt 208.2 lb

## 2020-10-26 DIAGNOSIS — E119 Type 2 diabetes mellitus without complications: Secondary | ICD-10-CM | POA: Insufficient documentation

## 2020-10-26 DIAGNOSIS — F17210 Nicotine dependence, cigarettes, uncomplicated: Secondary | ICD-10-CM | POA: Insufficient documentation

## 2020-10-26 DIAGNOSIS — Z7984 Long term (current) use of oral hypoglycemic drugs: Secondary | ICD-10-CM | POA: Insufficient documentation

## 2020-10-26 DIAGNOSIS — Z79899 Other long term (current) drug therapy: Secondary | ICD-10-CM | POA: Insufficient documentation

## 2020-10-26 DIAGNOSIS — K5900 Constipation, unspecified: Secondary | ICD-10-CM | POA: Insufficient documentation

## 2020-10-26 DIAGNOSIS — D72829 Elevated white blood cell count, unspecified: Secondary | ICD-10-CM | POA: Insufficient documentation

## 2020-10-26 DIAGNOSIS — F419 Anxiety disorder, unspecified: Secondary | ICD-10-CM | POA: Insufficient documentation

## 2020-10-26 LAB — CMP (CANCER CENTER ONLY)
ALT: 11 U/L (ref 0–44)
AST: 11 U/L — ABNORMAL LOW (ref 15–41)
Albumin: 4.1 g/dL (ref 3.5–5.0)
Alkaline Phosphatase: 50 U/L (ref 38–126)
Anion gap: 8 (ref 5–15)
BUN: 11 mg/dL (ref 6–20)
CO2: 26 mmol/L (ref 22–32)
Calcium: 9.6 mg/dL (ref 8.9–10.3)
Chloride: 103 mmol/L (ref 98–111)
Creatinine: 0.83 mg/dL (ref 0.44–1.00)
GFR, Estimated: >60 mL/min (ref >60)
Glucose, Bld: 105 mg/dL — ABNORMAL HIGH (ref 70–99)
Potassium: 3.9 mmol/L (ref 3.5–5.1)
Sodium: 137 mmol/L (ref 135–145)
Total Bilirubin: 0.3 mg/dL (ref 0.3–1.2)
Total Protein: 6.9 g/dL (ref 6.5–8.1)

## 2020-10-26 LAB — CBC WITH DIFFERENTIAL (CANCER CENTER ONLY)
Abs Immature Granulocytes: 0.04 10*3/uL (ref 0.00–0.07)
Basophils Absolute: 0.1 10*3/uL (ref 0.0–0.1)
Basophils Relative: 0 %
Eosinophils Absolute: 0.3 10*3/uL (ref 0.0–0.5)
Eosinophils Relative: 2 %
HCT: 41.7 % (ref 36.0–46.0)
Hemoglobin: 14.4 g/dL (ref 12.0–15.0)
Immature Granulocytes: 0 %
Lymphocytes Relative: 31 %
Lymphs Abs: 3.6 10*3/uL (ref 0.7–4.0)
MCH: 30.8 pg (ref 26.0–34.0)
MCHC: 34.5 g/dL (ref 30.0–36.0)
MCV: 89.1 fL (ref 80.0–100.0)
Monocytes Absolute: 0.9 10*3/uL (ref 0.1–1.0)
Monocytes Relative: 8 %
Neutro Abs: 6.6 10*3/uL (ref 1.7–7.7)
Neutrophils Relative %: 59 %
Platelet Count: 285 10*3/uL (ref 150–400)
RBC: 4.68 MIL/uL (ref 3.87–5.11)
RDW: 11.8 % (ref 11.5–15.5)
WBC Count: 11.4 10*3/uL — ABNORMAL HIGH (ref 4.0–10.5)
nRBC: 0 % (ref 0.0–0.2)

## 2020-10-26 LAB — LACTATE DEHYDROGENASE: LDH: 93 U/L — ABNORMAL LOW (ref 98–192)

## 2020-10-26 LAB — SAVE SMEAR(SSMR), FOR PROVIDER SLIDE REVIEW

## 2020-10-26 NOTE — Telephone Encounter (Signed)
Per los 10/26/20 no schedule is needed.

## 2020-10-26 NOTE — Progress Notes (Signed)
Hematology/Oncology Consultation   Name: Anita Cox      MRN: [id number]    Location: Room/bed info not found  Date: 10/26/2020 Time:9:26 AM   REFERRING PHYSICIAN: Sandford Craze, NP  REASON FOR CONSULT: Leukocytosis   DIAGNOSIS: Leukocytosis, reactive (smoking)  HISTORY OF PRESENT ILLNESS: Anita Cox is a very pleasant 40 yo caucasian female with an over 9 year history of mild leukocytosis.  She as remained asymptomatic with this.  No issues with frequent or recurrent infections.  No fever, chills, n/v, cough, rash, SOB, chest pain, palpitations, abdominal pain or changes in bowel or bladder habits.  She is a smoker and states that she smokes at least 1 ppd. She is hoping to start Chantix to help her quit once is being manufactured again.  She has mild fatigue at times. She notes that this has improved with cutting out fast food and reducing her sugar intake. She states that she has lost a total of 8 lbs so far. She has maintained a good appetite and is staying well hydrated throughout the day.  She has occasional dizziness with sinus congestion.  She has intermittent constipation but states that she does not have to take a stool softener.  Her cycle on oral birth control is regular and typically only heavy for 1 day.  She has 1 adopted son. No history of pregnancy or miscarriage.  She is due for her first mammogram and plans to schedule with her gynecologist.  No personal or familial history of cancer.  The is now family history of blood irregularity or abnormality that she is aware of.  She has prediabetic and takes Metformin daily.  No history of thyroid disease.  She has the occasional rare glass of wine. No recreational drug use.  She has had dental surgery (wisdom teeth extraction and gum transplant) in the past without any complications.  No swelling or tenderness in her extremities.  She occasionally has positional numbness and tingling in her hands when driving or  sleeping a certain way. This resolves with movement.  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   ROS: All other 10 point review of systems is negative.   PAST MEDICAL HISTORY:   Past Medical History:  Diagnosis Date  . Acute bronchitis 12/27/2011  . Anemia   . Anxiety   . Frequent UTI   . Hay fever    with allergies  . History of chicken pox   . History of kidney stones    07/2016  . Pre-diabetes     ALLERGIES: No Known Allergies    MEDICATIONS:  Current Outpatient Medications on File Prior to Visit  Medication Sig Dispense Refill  . cetirizine (ZYRTEC) 10 MG tablet Take 10 mg by mouth daily.    . clonazePAM (KLONOPIN) 0.5 MG tablet Take 0.5 mg by mouth 2 (two) times daily as needed.    . metFORMIN (GLUCOPHAGE-XR) 500 MG 24 hr tablet Take 500 mg by mouth daily with breakfast.     . TRI-PREVIFEM 0.18/0.215/0.25 MG-35 MCG tablet Take 1 tablet by mouth daily.  11   No current facility-administered medications on file prior to visit.     PAST SURGICAL HISTORY Past Surgical History:  Procedure Laterality Date  . NO PAST SURGERIES     denies surgical history    FAMILY HISTORY: Family History  Problem Relation Age of Onset  . Arthritis Mother   . Drug abuse Mother   . Diabetes Other  grandmother  . Diabetes Paternal Grandfather   . Diabetes Paternal Grandmother   . Lupus Half-Sister   . OCD Half-Sister   . Bipolar disorder Half-Sister   . ADD / ADHD Half-Sister   . Anxiety disorder Half-Brother   . Irritable bowel syndrome Half-Brother   . Diabetes Mellitus II Maternal Grandmother   . Urinary tract infection Maternal Grandmother   . Alcohol abuse Maternal Grandfather     SOCIAL HISTORY:  reports that she has been smoking cigarettes. She has been smoking about 1.00 pack per day. She has never used smokeless tobacco. She reports current alcohol use. She reports that she does not use drugs.  PERFORMANCE STATUS: The patient's performance  status is 0 - Asymptomatic  PHYSICAL EXAM: Most Recent Vital Signs: Blood pressure 125/82, pulse 79, temperature 98.3 F (36.8 C), temperature source Oral, resp. rate 18, weight 208 lb 4 oz (94.5 kg), SpO2 100 %. BP 125/82 (BP Location: Right Arm, Patient Position: Sitting)   Pulse 79   Temp 98.3 F (36.8 C) (Oral)   Resp 18   Wt 208 lb 4 oz (94.5 kg)   SpO2 100%   BMI 39.35 kg/m   General Appearance:    Alert, cooperative, no distress, appears stated age  Head:    Normocephalic, without obvious abnormality, atraumatic  Eyes:    PERRL, conjunctiva/corneas clear, EOM's intact, fundi    benign, both eyes        Throat:   Lips, mucosa, and tongue normal; teeth and gums normal  Neck:   Supple, symmetrical, trachea midline, no adenopathy;    thyroid:  no enlargement/tenderness/nodules; no carotid   bruit or JVD  Back:     Symmetric, no curvature, ROM normal, no CVA tenderness  Lungs:     Clear to auscultation bilaterally, respirations unlabored  Chest Wall:    No tenderness or deformity   Heart:    Regular rate and rhythm, S1 and S2 normal, no murmur, rub   or gallop     Abdomen:     Soft, non-tender, bowel sounds active all four quadrants,    no masses, no organomegaly        Extremities:   Extremities normal, atraumatic, no cyanosis or edema  Pulses:   2+ and symmetric all extremities  Skin:   Skin color, texture, turgor normal, no rashes or lesions  Lymph nodes:   Cervical, supraclavicular, and axillary nodes normal  Neurologic:   CNII-XII intact, normal strength, sensation and reflexes    throughout    LABORATORY DATA:  Results for orders placed or performed in visit on 10/26/20 (from the past 48 hour(s))  CMP (Cancer Center only)     Status: Abnormal   Collection Time: 10/26/20  8:06 AM  Result Value Ref Range   Sodium 137 135 - 145 mmol/L   Potassium 3.9 3.5 - 5.1 mmol/L   Chloride 103 98 - 111 mmol/L   CO2 26 22 - 32 mmol/L   Glucose, Bld 105 (H) 70 - 99 mg/dL     Comment: Glucose reference range applies only to samples taken after fasting for at least 8 hours.   BUN 11 6 - 20 mg/dL   Creatinine 6.38 7.56 - 1.00 mg/dL   Calcium 9.6 8.9 - 43.3 mg/dL   Total Protein 6.9 6.5 - 8.1 g/dL   Albumin 4.1 3.5 - 5.0 g/dL   AST 11 (L) 15 - 41 U/L   ALT 11 0 - 44 U/L   Alkaline Phosphatase  50 38 - 126 U/L   Total Bilirubin 0.3 0.3 - 1.2 mg/dL   GFR, Estimated >28 >76 mL/min    Comment: (NOTE) Calculated using the CKD-EPI Creatinine Equation (2021)    Anion gap 8 5 - 15    Comment: Performed at [facility] at [facility], [address] [place], [place] [address]  Save Smear [facility])     Status: None   Collection Time: 10/26/20  8:06 AM  Result Value Ref Range   Smear Review SMEAR STAINED AND AVAILABLE FOR REVIEW     Comment: Performed at [facility] at [facility], [address] [place], [place] [address]  CBC with Differential (Cancer Center Only)     Status: Abnormal   Collection Time: 10/26/20  8:06 AM  Result Value Ref Range   WBC Count 11.4 (H) 4.0 - 10.5 K/uL   RBC 4.68 3.87 - 5.11 MIL/uL   Hemoglobin 14.4 12.0 - 15.0 g/dL   HCT 55.9 74.1 - 63.8 %   MCV 89.1 80.0 - 100.0 fL   MCH 30.8 26.0 - 34.0 pg   MCHC 34.5 30.0 - 36.0 g/dL   RDW 45.3 64.6 - 80.3 %   Platelet Count 285 150 - 400 K/uL   nRBC 0.0 0.0 - 0.2 %   Neutrophils Relative % 59 %   Neutro Abs 6.6 1.7 - 7.7 K/uL   Lymphocytes Relative 31 %   Lymphs Abs 3.6 0.7 - 4.0 K/uL   Monocytes Relative 8 %   Monocytes Absolute 0.9 0.1 - 1.0 K/uL   Eosinophils Relative 2 %   Eosinophils Absolute 0.3 0.0 - 0.5 K/uL   Basophils Relative 0 %   Basophils Absolute 0.1 0.0 - 0.1 K/uL   Immature Granulocytes 0 %   Abs Immature Granulocytes 0.04 0.00 - 0.07 K/uL    Comment: Performed at [facility] at [facility], [address], [place], [place] [address]      RADIOGRAPHY: No results found.     PATHOLOGY:  None  ASSESSMENT/PLAN: Ms. Tulloch is a very pleasant 40 yo caucasian female with an over 9 year history of mild leukocytosis. Her blood smear was reviewed in detail by Dr. Myna Hidalgo. No abnormality or evidence of malignancy noted.  She remains asymptomatic and has no complaints at this time.  Her leukocytosis is felt to likely be reactive due to her being a smoker.  At this point we can release her back to her PCP.   All questions were answered. She can contact our office with any heme/onc questions or concerns. We can certainly see her again in the future of needed.   The patient was discussed with Dr. Myna Hidalgo and he is in agreement with the aforementioned.   Emeline Gins, NP

## 2020-12-03 ENCOUNTER — Ambulatory Visit: Payer: Managed Care, Other (non HMO) | Admitting: Medical

## 2020-12-03 ENCOUNTER — Other Ambulatory Visit: Payer: Self-pay

## 2020-12-03 VITALS — BP 132/76 | HR 77 | Resp 20 | Ht 61.0 in | Wt 206.0 lb

## 2020-12-03 DIAGNOSIS — M79644 Pain in right finger(s): Secondary | ICD-10-CM | POA: Diagnosis not present

## 2020-12-03 DIAGNOSIS — M79631 Pain in right forearm: Secondary | ICD-10-CM

## 2020-12-03 DIAGNOSIS — G4700 Insomnia, unspecified: Secondary | ICD-10-CM

## 2020-12-03 MED ORDER — HYDROXYZINE HCL 25 MG PO TABS: ORAL_TABLET | ORAL | 0 refills | Status: DC

## 2020-12-03 NOTE — Patient Instructions (Addendum)
You do have combination of forearm pain, elbow area pain(tennis elbow like) and thumb tendinitis type pain.   Will refer to sports med MD.  Anita Cox could get thumb spica splint over the counter and tennis elbow brace pending referral to sports med.  Can use combination of low dose tylenol with moderate dose ibuprofen for pain and inflammation.   For insomnia rx'd hydroxyzine. Rx advisement.  Follow up in 2 weeks or as needed.

## 2020-12-03 NOTE — Progress Notes (Signed)
Subjective:    Patient ID: Anita Cox, female    DOB: 1981-06-25, 40 y.o.   MRN: [id number]  HPI  Pt in for some rt arm pain. She describes from her hand all the way up her arm. Pain noticed mostly on flexing and extending wrist. Some on flexing arm. Pt is rt handed. No arm swelling. No sob. 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 But some intermittent yard work about 2 weeks ago.   Pt states several weeks of pain. But worse past 2 weeks.  Sometimes wakes up in middle of night with hands feeling tingle.  Pt also mentioned insomnia. States has  Been of and on for a month or 2. Pt feels stressed and worried. Often work related.    Review of Systems  Constitutional: Negative for chills, fatigue and fever.  Respiratory: Negative for cough, chest tightness, shortness of breath and wheezing.   Cardiovascular: Negative for chest pain and palpitations.  Gastrointestinal: Negative for abdominal pain.  Musculoskeletal: Negative for back pain.       See hpi.  Skin: Negative for rash.  Neurological: Negative for dizziness and headaches.  Hematological: Negative for adenopathy. Does not bruise/bleed easily.  Psychiatric/Behavioral: Negative for behavioral problems.     Past Medical History:  Diagnosis Date  . Acute bronchitis 12/27/2011  . Anemia   . Anxiety   . Frequent UTI   . Hay fever    with allergies  . History of chicken pox   . History of kidney stones    07/2016  . Pre-diabetes      Social History   Socioeconomic History  . Marital status: Married    Spouse name: Not on file  . Number of children: Not on file  . Years of education: Not on file  . Highest education level: Not on file  Occupational History  . Not on file  Tobacco Use  . Smoking status: Current Every Day Smoker    Packs/day: 1.00    Types: Cigarettes  . Smokeless tobacco: Never Used  Vaping Use  . Vaping Use: Never used  Substance and Sexual Activity  . Alcohol use: Yes    Comment: rare  wine  . Drug use: No  . Sexual activity: Not Currently  Other Topics Concern  . 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 NO PAST SURGERIES     denies surgical history    Family History  Problem Relation Age of Onset  . Arthritis Mother   . Drug abuse Mother   . Diabetes Other        grandmother  . Diabetes Paternal Grandfather   . Diabetes Paternal Grandmother   . Lupus Half-Sister   . OCD Half-Sister   . Bipolar disorder Half-Sister   . ADD / ADHD Half-Sister   . Anxiety disorder Half-Brother   . Irritable  bowel syndrome Half-Brother   . Diabetes Mellitus II Maternal Grandmother   . Urinary tract infection Maternal Grandmother   . Alcohol abuse Maternal Grandfather     No Known Allergies  Current Outpatient Medications on File Prior to Visit  Medication Sig Dispense Refill  . cetirizine (ZYRTEC) 10 MG tablet Take 10 mg by mouth daily.    . clonazePAM (KLONOPIN) 0.5 MG tablet Take 0.5 mg by mouth 2 (two) times daily as needed.    . metFORMIN (GLUCOPHAGE-XR) 500 MG 24 hr tablet Take 500 mg by mouth daily with breakfast.     . TRI-PREVIFEM 0.18/0.215/0.25 MG-35 MCG tablet Take 1 tablet by mouth daily.  11   No current facility-administered medications on file prior to visit.    BP 132/76   Pulse 77   Resp 20   Ht 5\' 1"  (1.549 m)   Wt 206 lb (93.4 kg)   SpO2 100%   BMI 38.92 kg/m       Objective:   Physical  Exam   General- No acute distress. Pleasant patient. Neck- Full range of motion, no jvd Lungs- Clear, even and unlabored. Heart- regular rate and rhythm. Neurologic- CNII- XII grossly intact.  Rt upper ext- bicep area/upper arm symmetric compared to left side. No bruits right side.  Rt Elbow- good range of motion. No joint swelling. Proximal forearm tender on palpation. Pain in area when wrist flexed and extended. No pain on supination.   Rt wrist- on manipulation. No tingling to fingers.          Assessment & Plan:  You do have combination of forearm pain, elbow area pain(tennis elbow like) and thumb tendinitis type pain.   Will refer to sports med MD.  could get thumb spica splint over the counter and tennis elbow brace pending referral to sports med.  Can use combination of low dose tylenol with moderate dose ibuprofen.  For insomnia rx'd hydroxyzine. Rx advisement.  Follow up in 2 weeks or as needed.  Bonita Quin, PA-C

## 2020-12-21 ENCOUNTER — Other Ambulatory Visit: Payer: Self-pay

## 2020-12-21 ENCOUNTER — Ambulatory Visit: Payer: Managed Care, Other (non HMO) | Admitting: Family Medicine

## 2020-12-21 ENCOUNTER — Encounter: Payer: Self-pay | Admitting: Family Medicine

## 2020-12-21 VITALS — BP 118/88 | Ht 61.0 in | Wt 206.0 lb

## 2020-12-21 DIAGNOSIS — M5412 Radiculopathy, cervical region: Secondary | ICD-10-CM | POA: Insufficient documentation

## 2020-12-21 MED ORDER — GABAPENTIN 100 MG PO CAPS: 100.0000 mg | ORAL_CAPSULE | Freq: Three times a day (TID) | ORAL | 1 refills | Status: DC | PRN

## 2020-12-21 NOTE — Patient Instructions (Signed)
Nice to meet you Please try the exercises  Please try the gabapentin at night if needed  Please send me a message in MyChart with any questions or updates.  Please see me back in 4 weeks.   --Dr. Jordan Likes

## 2020-12-21 NOTE — Progress Notes (Signed)
Anita Cox - 40 y.o. female MRN [id number]  Date of birth: 01-Aug-1981  SUBJECTIVE:  Including CC & ROS.  No chief complaint on file.   Anita Cox is a 40 y.o. female that is presenting with right arm pain.  The pain is been ongoing for 1 month.  She does take ibuprofen and Tylenol.  These do help her symptoms.  Denies any new or different activities.  No history of surgery.   Review of Systems See HPI   HISTORY: Past Medical, Surgical, Social, and Family History Reviewed & Updated per EMR.   Pertinent Historical Findings include:  Past Medical History:  Diagnosis Date  . Acute bronchitis 12/27/2011  . Anemia   . Anxiety   . Frequent UTI   . Hay fever    with allergies  . History of chicken pox   . History of kidney stones    07/2016  . Pre-diabetes     Past Surgical History:  Procedure Laterality Date  . NO PAST SURGERIES     denies surgical history    Family History  Problem Relation Age of Onset  . Arthritis Mother   . Drug abuse Mother   . Diabetes Other        grandmother  . Diabetes Paternal Grandfather   . Diabetes Paternal Grandmother   . Lupus Half-Sister   . OCD Half-Sister   . Bipolar disorder Half-Sister   . ADD / ADHD Half-Sister   . Anxiety disorder Half-Brother   . Irritable bowel syndrome Half-Brother   . Diabetes Mellitus II Maternal Grandmother   . Urinary tract infection Maternal Grandmother   . Alcohol abuse Maternal Grandfather     Social History   Socioeconomic History  . Marital status: Married    Spouse name: Not on file  . Number of children: Not on file  . Years of education: Not on file  . Highest education level: Not on file  Occupational History  . Not on file  Tobacco Use  . Smoking status: Current Every Day Smoker    Packs/day: 1.00    Types: Cigarettes  . Smokeless tobacco: Never Used  Vaping Use  . Vaping Use: Never used  Substance and Sexual Activity  . Alcohol use: Yes    Comment: rare wine  . Drug  use: No  . Sexual activity: Not Currently  Other Topics Concern  . 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   Raised by her maternal GM- mother has drug abuse issues   One adopted son (biological nephew)   Married   4 cats   Enjoys reading, Clinical cytogeneticist, Horticulturist, commercial   Social Determinants of Health   Financial Resource Strain: Not on file  Food Insecurity: Not on file  Transportation Needs: Not on file  Physical Activity: Not on file  Stress: Not on file  Social Connections: Not on file  Intimate Partner Violence: Not on file     PHYSICAL EXAM:  VS: BP 118/88 (BP Location: Left Arm, Patient Position: Sitting, Cuff Size: Large)   Ht 5\' 1"  (1.549 m)   Wt 206 lb (93.4 kg)   BMI 38.92 kg/m  Physical Exam Gen: NAD, alert, cooperative with exam, well-appearing MSK:  Right arm: Normal range of motion. Normal strength resistance. No signs of atrophy. No tenderness to palpation at the lateral epicondyle. Normal strength resistance with wrist extension and flexion. Neurovascular intact   ASSESSMENT &  PLAN:   Cervical radiculopathy Symptoms seem most associated with radicular pain.  Less likely for origin at the elbow. -Counseled on home exercise therapy and supportive care. -Gabapentin. -Could consider imaging or physical therapy.

## 2020-12-21 NOTE — Assessment & Plan Note (Signed)
Symptoms seem most associated with radicular pain.  Less likely for origin at the elbow. -Counseled on home exercise therapy and supportive care. -Gabapentin. -Could consider imaging or physical therapy.

## 2020-12-23 ENCOUNTER — Ambulatory Visit (HOSPITAL_BASED_OUTPATIENT_CLINIC_OR_DEPARTMENT_OTHER)
Admission: RE | Admit: 2020-12-23 | Discharge: 2020-12-23 | Disposition: A | Discharge status: Home / Self Care | Payer: Managed Care, Other (non HMO) | Source: Ambulatory Visit | Attending: Family | Admitting: Family

## 2020-12-23 ENCOUNTER — Other Ambulatory Visit: Payer: Self-pay

## 2020-12-23 DIAGNOSIS — Z0000 Encounter for general adult medical examination without abnormal findings: Secondary | ICD-10-CM

## 2020-12-23 DIAGNOSIS — Z1231 Encounter for screening mammogram for malignant neoplasm of breast: Secondary | ICD-10-CM | POA: Diagnosis present

## 2020-12-30 ENCOUNTER — Ambulatory Visit (INDEPENDENT_AMBULATORY_CARE_PROVIDER_SITE_OTHER): Payer: Managed Care, Other (non HMO) | Admitting: Otolaryngology

## 2021-02-18 ENCOUNTER — Encounter: Payer: Self-pay | Admitting: Family Medicine

## 2021-02-18 ENCOUNTER — Ambulatory Visit: Payer: Managed Care, Other (non HMO) | Admitting: Family Medicine

## 2021-02-18 VITALS — BP 136/78 | HR 84 | Temp 97.9°F | Ht 61.0 in | Wt 205.1 lb

## 2021-02-18 DIAGNOSIS — M25562 Pain in left knee: Secondary | ICD-10-CM

## 2021-02-18 MED ORDER — MELOXICAM 15 MG PO TABS: 15.0000 mg | ORAL_TABLET | Freq: Every day | ORAL | 0 refills | Status: DC

## 2021-02-18 NOTE — Progress Notes (Signed)
Musculoskeletal Exam  Patient: Anita Cox DOB: [birth year]  DOS: 02/18/2021  SUBJECTIVE:  Chief Complaint:   Chief Complaint  Patient presents with  . Knee Pain    Left knee pain     URIJAH Cox is a 40 y.o.  female for evaluation and treatment of L knee pain.   Onset:  1 week ago. No inj or change in activity.  Location: all around L knee Character:  aching  Progression of issue:  has worsened Associated symptoms: swelling; no decreased ROM, redness, bruising; no catching or locking Hurts when she pushes on the back of her medial hamstring Treatment: to date has been ice, OTC NSAIDS and acetaminophen.   Neurovascular symptoms: no  Past Medical History:  Diagnosis Date  . Acute bronchitis 12/27/2011  . Anemia   . Anxiety   . Frequent UTI   . Hay fever    with allergies  . History of chicken pox   . History of kidney stones    07/2016  . Pre-diabetes     Objective: VITAL SIGNS: BP 136/78 (BP Location: Left Arm, Patient Position: Sitting, Cuff Size: Normal)   Pulse 84   Temp 97.9 F (36.6 C) (Oral)   Ht 5\' 1"  (1.549 m)   Wt 205 lb 2 oz (93 kg)   SpO2 98%   BMI 38.76 kg/m  Constitutional: Well formed, well developed. No acute distress. Thorax & Lungs: No accessory muscle use Musculoskeletal: L knee.   Normal active range of motion: yes.   Normal passive range of motion: yes Tenderness to palpation: yes, over the medial hamstring Deformity: no Ecchymosis: no Tests positive: none Tests negative: Lachman's, McMurray's, Stines, patellar apprehension/grind, varus/valgus stress Neurologic: Normal sensory function. Antalgic gait.  Psychiatric: Normal mood. Age appropriate judgment and insight. Alert & oriented x 3.    Assessment:  Acute pain of left knee - Plan: meloxicam (MOBIC) 15 MG tablet  Plan: I think it's medial hamstring. Stretches/exercises, heat, ice, Tylenol. Mobic. Send message in 3-4 weeks, will refer to sports med if no better.  F/u  prn. The patient voiced understanding and agreement to the plan.   [place], DO 02/18/21  3:33 PM

## 2021-02-18 NOTE — Patient Instructions (Signed)
Ice/cold pack over area for 10-15 min twice daily.  Heat (pad or rice pillow in microwave) over affected area, 10-15 minutes twice daily.   OK to take Tylenol 1000 mg (2 extra strength tabs) or 975 mg (3 regular strength tabs) every 6 hours as needed.  Let us know if you need anything.  Knee Exercises It is normal to feel mild stretching, pulling, tightness, or discomfort as you do these exercises, but you should stop right away if you feel sudden pain or your pain gets worse. STRETCHING AND RANGE OF MOTION EXERCISES  These exercises warm up your muscles and joints and improve the movement and flexibility of your knee. These exercises also help to relieve pain, numbness, and tingling. Exercise A: Knee Extension, Prone  1. Lie on your abdomen on a bed. 2. Place your left / right knee just beyond the edge of the surface so your knee is not on the bed. You can put a towel under your left / right thigh just above your knee for comfort. 3. Relax your leg muscles and allow gravity to straighten your knee. You should feel a stretch behind your left / right knee. 4. Hold this position for 30 seconds. 5. Scoot up so your knee is supported between repetitions. Repeat 2 times. Complete this stretch 3 times per week. Exercise B: Knee Flexion, Active    1. Lie on your back with both knees straight. If this causes back discomfort, bend your left / right knee so your foot is flat on the floor. 2. Slowly slide your left / right heel back toward your buttocks until you feel a gentle stretch in the front of your knee or thigh. 3. Hold this position for 30 seconds. 4. Slowly slide your left / right heel back to the starting position. Repeat 2 times. Complete this exercise 3 times per week. Exercise C: Quadriceps, Prone    1. Lie on your abdomen on a firm surface, such as a bed or padded floor. 2. Bend your left / right knee and hold your ankle. If you cannot reach your ankle or pant leg, loop a belt  around your foot and grab the belt instead. 3. Gently pull your heel toward your buttocks. Your knee should not slide out to the side. You should feel a stretch in the front of your thigh and knee. 4. Hold this position for 30 seconds. Repeat 2 times. Complete this stretch 3 times per week. Exercise D: Hamstring, Supine  1. Lie on your back. 2. Loop a belt or towel over the ball of your left / right foot. The ball of your foot is on the walking surface, right under your toes. 3. Straighten your left / right knee and slowly pull on the belt to raise your leg until you feel a gentle stretch behind your knee. ? Do not let your left / right knee bend while you do this. ? Keep your other leg flat on the floor. 4. Hold this position for 30 seconds. Repeat 2 times. Complete this stretch 3 times per week. STRENGTHENING EXERCISES  These exercises build strength and endurance in your knee. Endurance is the ability to use your muscles for a long time, even after they get tired. Exercise E: Quadriceps, Isometric    1. Lie on your back with your left / right leg extended and your other knee bent. Put a rolled towel or small pillow under your knee if told by your health care provider. 2. Slowly tense  the muscles in the front of your left / right thigh. You should see your kneecap slide up toward your hip or see increased dimpling just above the knee. This motion will push the back of the knee toward the floor. 3. For 3 seconds, keep the muscle as tight as you can without increasing your pain. 4. Relax the muscles slowly and completely. Repeat for 10 total reps Repeat 2 ti mes. Complete this exercise 3 times per week. Exercise F: Straight Leg Raises - Quadriceps  1. Lie on your back with your left / right leg extended and your other knee bent. 2. Tense the muscles in the front of your left / right thigh. You should see your kneecap slide up or see increased dimpling just above the knee. Your thigh may even  shake a bit. 3. Keep these muscles tight as you raise your leg 4-6 inches (10-15 cm) off the floor. Do not let your knee bend. 4. Hold this position for 3 seconds. 5. Keep these muscles tense as you lower your leg. 6. Relax your muscles slowly and completely after each repetition. 10 total reps. Repeat 2 times. Complete this exercise 3 times per week.  Exercise G: Hamstring Curls    If told by your health care provider, do this exercise while wearing ankle weights. Begin with 5 lb weights (optional). Then increase the weight by 1 lb (0.5 kg) increments. Do not wear ankle weights that are more than 20 lbs to start with. 1. Lie on your abdomen with your legs straight. 2. Bend your left / right knee as far as you can without feeling pain. Keep your hips flat against the floor. 3. Hold this position for 3 seconds. 4. Slowly lower your leg to the starting position. Repeat for 10 reps.  Repeat 2 times. Complete this exercise 3 times per week. Exercise H: Squats (Quadriceps)  1. Stand in front of a table, with your feet and knees pointing straight ahead. You may rest your hands on the table for balance but not for support. 2. Slowly bend your knees and lower your hips like you are going to sit in a chair. ? Keep your weight over your heels, not over your toes. ? Keep your lower legs upright so they are parallel with the table legs. ? Do not let your hips go lower than your knees. ? Do not bend lower than told by your health care provider. ? If your knee pain increases, do not bend as low. 3. Hold the squat position for 1 second. 4. Slowly push with your legs to return to standing. Do not use your hands to pull yourself to standing. Repeat 2 times. Complete this exercise 3 times per week. Exercise I: Wall Slides (Quadriceps)    1. Lean your back against a smooth wall or door while you walk your feet out 18-24 inches (46-61 cm) from it. 2. Place your feet hip-width apart. 3. Slowly slide  down the wall or door until your knees Repeat 2 times. Complete this exercise every other day. 4. Exercise K: Straight Leg Raises - Hip Abductors  1. Lie on your side with your left / right leg in the top position. Lie so your head, shoulder, knee, and hip line up. You may bend your bottom knee to help you keep your balance. 2. Roll your hips slightly forward so your hips are stacked directly over each other and your left / right knee is facing forward. 3. Leading with your heel,  lift your top leg 4-6 inches (10-15 cm). You should feel the muscles in your outer hip lifting. ? Do not let your foot drift forward. ? Do not let your knee roll toward the ceiling. 4. Hold this position for 3 seconds. 5. Slowly return your leg to the starting position. 6. Let your muscles relax completely after each repetition. 10 total reps. Repeat 2 times. Complete this exercise 3 times per week. Exercise J: Straight Leg Raises - Hip Extensors  1. Lie on your abdomen on a firm surface. You can put a pillow under your hips if that is more comfortable. 2. Tense the muscles in your buttocks and lift your left / right leg about 4-6 inches (10-15 cm). Keep your knee straight as you lift your leg. 3. Hold this position for 3 seconds. 4. Slowly lower your leg to the starting position. 5. Let your leg relax completely after each repetition. Repeat 2 times. Complete this exercise 3 times per week. Document Released: 07/19/2005 Document Revised: 05/29/2016 Document Reviewed: 07/11/2015 Elsevier Interactive Patient Education  2017 [employer].

## 2021-03-17 ENCOUNTER — Other Ambulatory Visit: Payer: Self-pay | Admitting: Family Medicine

## 2021-03-17 DIAGNOSIS — M25562 Pain in left knee: Secondary | ICD-10-CM

## 2021-12-21 ENCOUNTER — Ambulatory Visit: Payer: Managed Care, Other (non HMO) | Admitting: Internal Medicine

## 2021-12-21 ENCOUNTER — Telehealth: Payer: Self-pay | Admitting: Family

## 2021-12-21 ENCOUNTER — Other Ambulatory Visit (HOSPITAL_BASED_OUTPATIENT_CLINIC_OR_DEPARTMENT_OTHER): Payer: Self-pay

## 2021-12-21 ENCOUNTER — Encounter: Payer: Self-pay | Admitting: Internal Medicine

## 2021-12-21 VITALS — BP 130/82 | HR 81 | Temp 98.4°F | Resp 16 | Ht 61.0 in | Wt 213.4 lb

## 2021-12-21 DIAGNOSIS — B029 Zoster without complications: Secondary | ICD-10-CM | POA: Diagnosis not present

## 2021-12-21 MED ORDER — VALACYCLOVIR HCL 1 G PO TABS
1000.0000 mg | ORAL_TABLET | Freq: Three times a day (TID) | ORAL | 0 refills | Status: DC
  Filled 2021-12-21: qty 21, 7d supply, fill #0

## 2021-12-21 NOTE — Telephone Encounter (Signed)
Pt stated she has fever blisters in her mouth and nose. Along with a swollen face and swollen lymph nodes. Not trouble breathing. Scheduled at 4 with Dr.Paz today. Also transferred to triage.  ?

## 2021-12-21 NOTE — Telephone Encounter (Signed)
FYI- Pt scheduled to see you at 4pm today.  ?

## 2021-12-21 NOTE — Patient Instructions (Signed)
Take Valtrex for 1 week ? ?Call if: You are not getting gradually better or if the blisters spread. ? ?Go to urgent care or an eye doctor if you have blisters near the eye or you experience eye pain. ? ?

## 2021-12-21 NOTE — Telephone Encounter (Signed)
Nurse Assessment ?Nurse: Elesa Hacker, RN, Nash Dimmer Date/Time Lamount Cohen Time): 12/21/2021 11:39:19 AM ?Confirm and document reason for call. If ?symptomatic, describe symptoms. ?---Caller states she has developed cold sores on her ?lips, her cheek is swollen, red and hard. She has ?a swollen gland on the side of her neck, left side. ?Symptoms started on Sunday. Unknown temp. ?Does the patient have any new or worsening ?symptoms? ---Yes ?Will a triage be completed? ---Yes ?Related visit to physician within the last 2 weeks? ---No ?Does the PT have any chronic conditions? (i.e. ?diabetes, asthma, this includes High risk factors for ?pregnancy, etc.) ?---No ?Is the patient pregnant or possibly pregnant? (Ask ?all females between the ages of 84-55) ---No ?Is this a behavioral health or substance abuse call? ---No ?Guidelines ?Guideline Title Affirmed Question Affirmed Notes Nurse Date/Time (Eastern ?Time) ?Cold Sores (Fever ?Blisters) ?[1] Red area ?spreading from cold ?sore AND [2] large ?(> 2 in. or 5 cm) ?Deaton, RN, Nash Dimmer 12/21/2021 11:40:26 ?AM ?Disp. Time (Eastern ?Time) Disposition Final User ?PLEASE NOTE: All timestamps contained within this report are represented as [place] Standard Time. ?CONFIDENTIALTY NOTICE: This fax transmission is intended only for the addressee. It contains information that is legally privileged, confidential or ?otherwise protected from use or disclosure. If you are not the intended recipient, you are strictly prohibited from reviewing, disclosing, copying using ?or disseminating any of this information or taking any action in reliance on or regarding this information. If you have received this fax in error, please ?notify us immediately by telephone so that we can arrange for its return to [place]. Phone: [phone], Toll-Free: [phone], Fax: [phone] ?Page: 2 of 2 ?Call Id: [id number] ?12/21/2021 11:44:57 AM See HCP within 4 Hours (or ?PCP triage) ?Yes Deaton, RN, Nash Dimmer ?Caller Disagree/Comply  Comply ?Caller Understands Yes ?PreDisposition Did not know what to do ?Care Advice Given Per Guideline ?SEE HCP (OR PCP TRIAGE) WITHIN 4 HOURS: * IF OFFICE WILL BE OPEN: You need to be seen within the next 3 or 4 ?hours. Call your doctor (or NP/PA) now or as soon as the office opens. CALL BACK IF: * You become worse CARE ADVICE ?given per Fever Blisters of Lip (Cold Sores) (Adult) guideline. ?Comments ?User: Wandra Scot, RN Date/Time Lamount Cohen Time): 12/21/2021 11:44:50 AM ?Spoke with Shiquita at the office, appt is made for 4pm Only appt available, caller does not want to go to [facility]. ?Referrals ?REFERRED TO PCP OFFICE ?

## 2021-12-21 NOTE — Progress Notes (Signed)
? ?Subjective:  ? ? Patient ID: Anita Cox, female    DOB: 1980-12-09, 41 y.o.   MRN: [id number] ? ?DOS:  12/21/2021 ?Type of visit - description: Acute. ? ?3 days ago, developed a couple of fever blisters on the upper lip, then the next  she developed one on the left side of the nose. ?2 days later she found a tender gland at the left neck. ?Overall, the blisters are drying up. ?When asked, she admits to some pain at the left cheek, I asked if it was like a sunburn and she said yes. ? 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 ? ? ?With the onset of symptoms there was a question of subjective fever. ?Her left eye it is slightly irritated with some clear discharge but denies eye pain or visual disturbances ?No otalgia or decreased hearing. ?Throat with no pain or blisters. ? ?Review of Systems ?See above  ? ?Past Medical History:  ?Diagnosis Date  ? Acute bronchitis 12/27/2011  ? Anemia   ? Anxiety   ? Frequent UTI   ? Hay fever   ? with allergies  ? History of chicken pox   ? History of kidney stones   ? 07/2016  ? Pre-diabetes   ? ? ?Past Surgical History:  ?Procedure Laterality Date  ? NO PAST SURGERIES    ? denies surgical history  ? ? ?Current Outpatient Medications  ?Medication Instructions  ? clonazePAM (KLONOPIN) 0.5 mg, Oral, 2 times daily PRN,    ? gabapentin (NEURONTIN) 100 mg, Oral, 3 times daily PRN  ? hydrOXYzine (ATARAX/VISTARIL) 25 MG tablet 1 tab po q hs prn insomnia  ? meloxicam (MOBIC) 15 mg, Oral, Daily  ? metFORMIN (GLUCOPHAGE-XR) 500 mg, Daily with breakfast  ? TRI-PREVIFEM 0.18/0.215/0.25 MG-35 MCG tablet 1 tablet, Oral, Daily  ? ? ?   ?Objective:  ? Physical Exam ?HENT:  ?   Head:  ? ? ?BP 130/82 (BP Location: Left Arm, Patient Position: Sitting, Cuff Size: Normal)   Pulse 81   Temp 98.4 ?F (36.9 ?C) (Oral)   Resp 16   Ht 5\' 1"  (1.549 m)   Wt 213 lb 6 oz (96.8 kg)   SpO2 96%   BMI 40.32 kg/m?  ?General:   ?Well developed, NAD, BMI noted. ?HEENT:  ?Normocephalic .  Face symmetric, atraumatic. ?EOMI.  Pupils equal and reactive.  Left eyes slightly watery throat: Symmetric with no lesions ?Lungs:  ?CTA B ?Normal respiratory effort, no intercostal retractions, no accessory muscle use. ?Heart: RRR,  no murmur.  ?Lower extremities: no pretibial edema bilaterally  ?Skin: Not pale. Not jaundice ?Neurologic:  ?alert & oriented X3.  ?Speech normal, gait appropriate for age and unassisted ?Psych--  ?Cognition and judgment appear intact.  ?Cooperative with normal attention span and concentration.  ?Behavior appropriate. ?No anxious or depressed appearing.  ? ?   ?Assessment   ? ?  ?41 year old female, PMH includes anxiety, PCOS, morbid obesity, on BCP presents with ?Shingles ?Patient presents with facial blisters, some pain at the L cheek and a tender lymph node on the left neck.  The dried up blisters on the upper lips are slightly beyond midline (to the R) but overall the picture seems to be shingles. ?This was explained to the patient, recommend 7 days of Valtrex. ?Her eye is slightly irritated but no obvious blisters there. ?Recommend to seek medical attention if she has obvious irritation of the eye, redness, eye pain, blisters around it.  She verbalized understanding.  See AVS. ? ? ?This visit occurred during the SARS-CoV-2 public health emergency.  Safety protocols were in place, including screening questions prior to the visit, additional usage of staff PPE, and extensive cleaning of exam room while observing appropriate contact time as indicated for disinfecting solutions.  ? ?

## 2022-01-07 IMAGING — MG MM DIGITAL SCREENING BILAT W/ TOMO AND CAD
8 series · 8 of 24 positions shown · non-contrast
Comparison: None.

CLINICAL DATA: Screening.

EXAM:
DIGITAL SCREENING BILATERAL MAMMOGRAM WITH TOMOSYNTHESIS AND CAD
TECHNIQUE: Bilateral screening digital craniocaudal and mediolateral oblique
mammograms were obtained. Bilateral screening digital breast
tomosynthesis was performed. The images were evaluated with
computer-aided detection.

[L CC synth-2D]
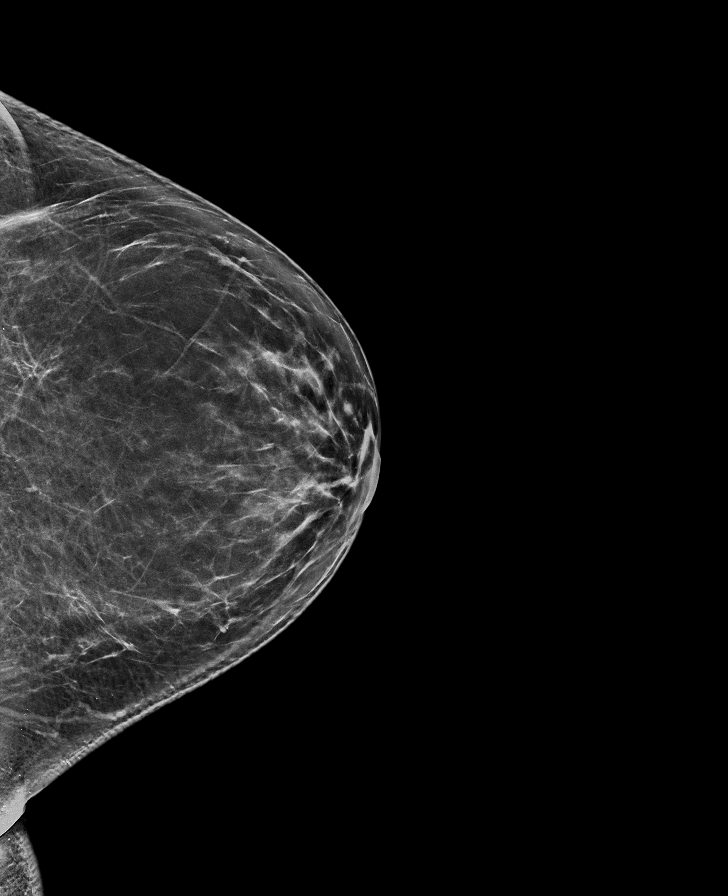

[L MLO synth-2D]
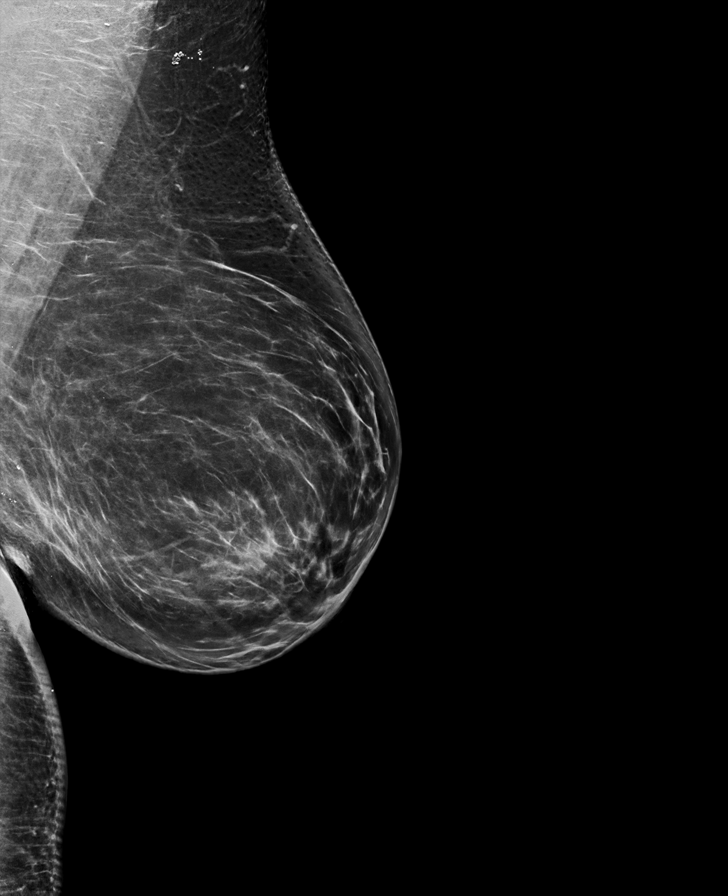

[R CC synth-2D]
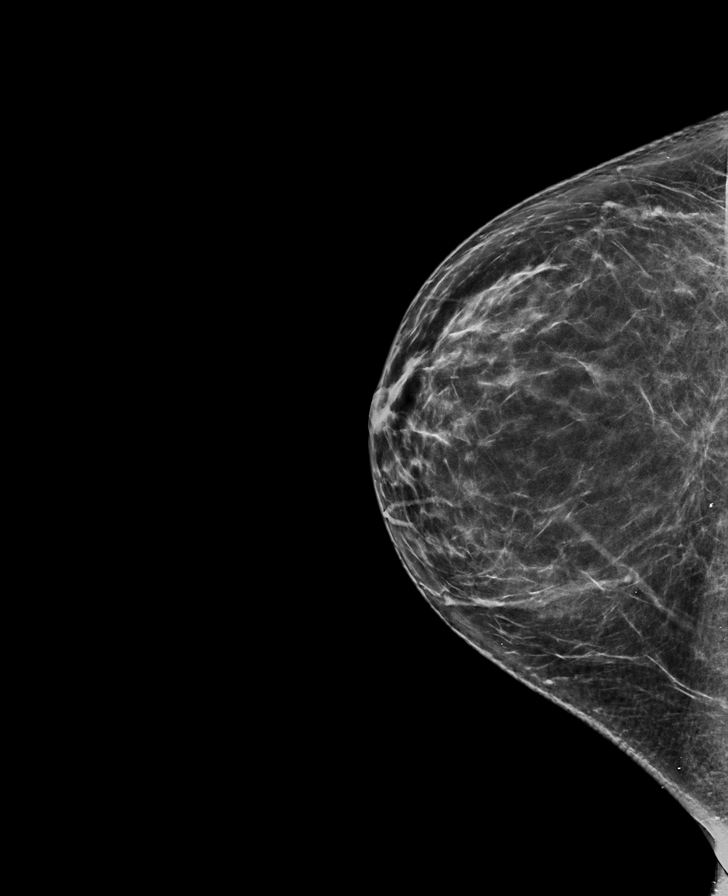

[R MLO synth-2D]
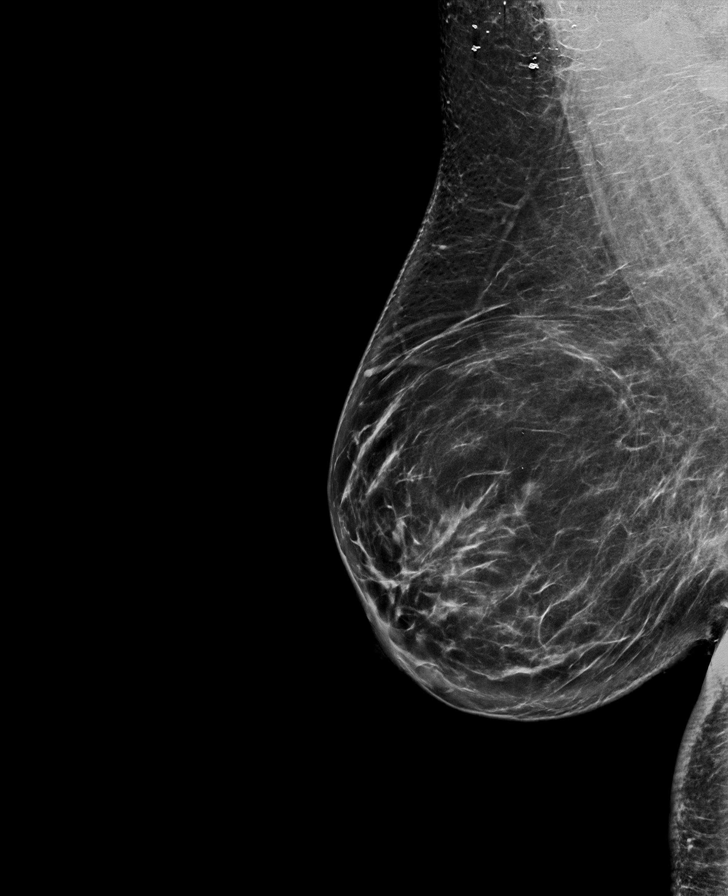

[R MLO tomo · tomo slice 45/89.0]
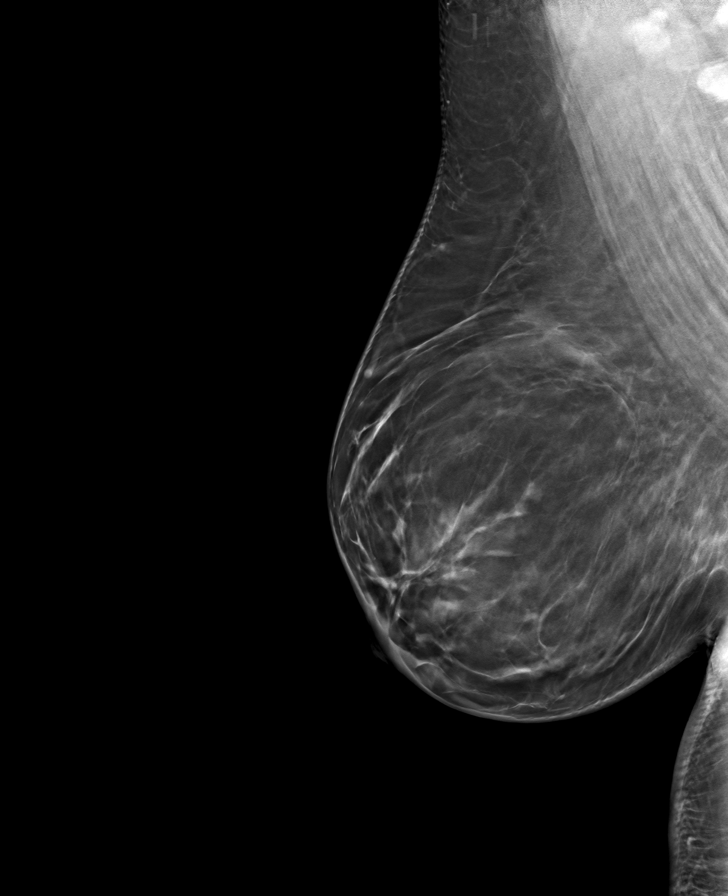

[R CC tomo · tomo slice 37/74.0]
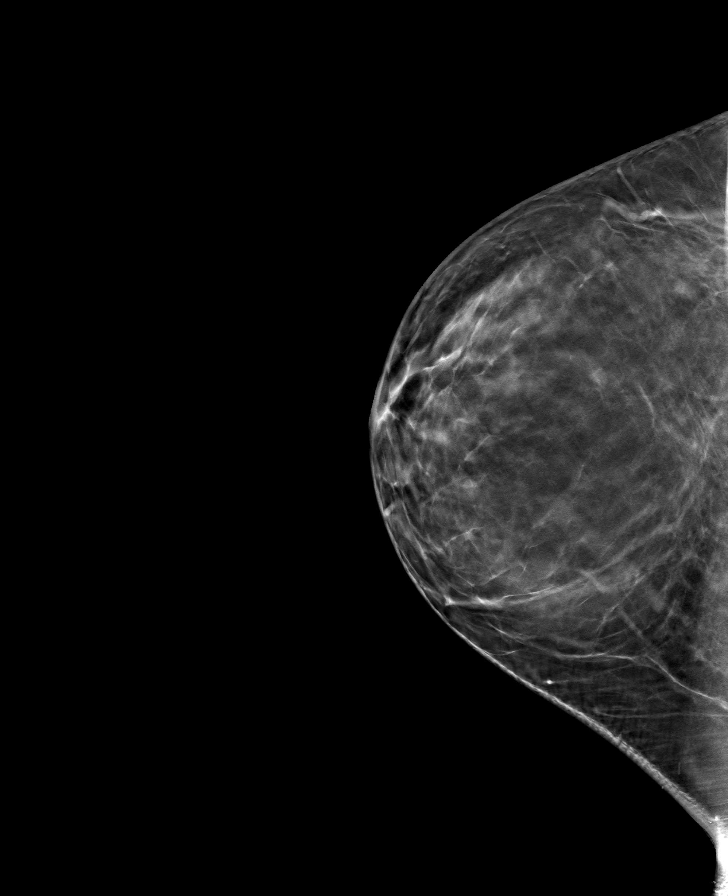

[L MLO tomo · tomo slice 45/90.0]
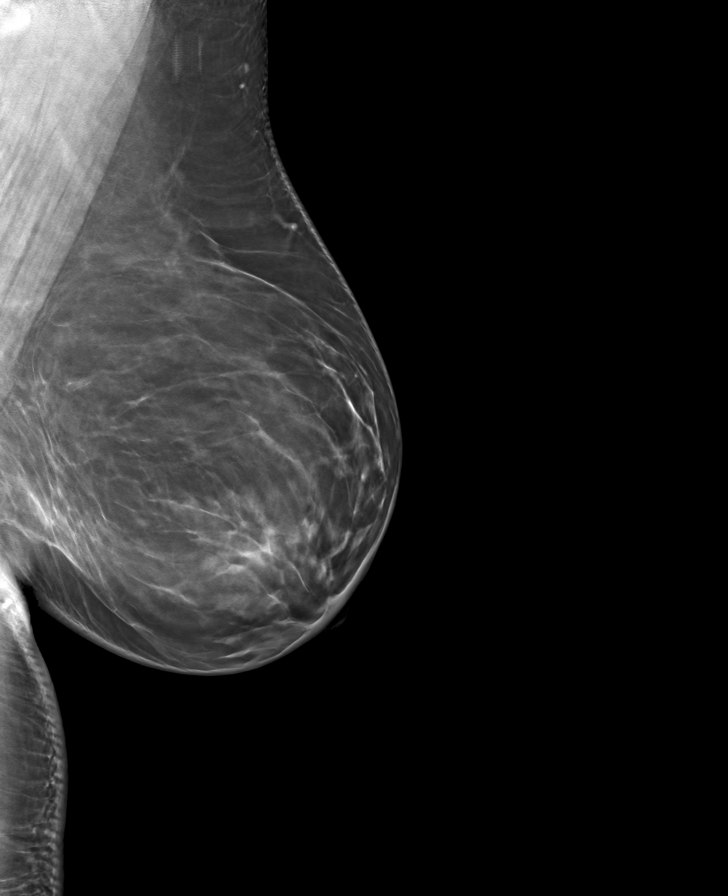

[L CC tomo · tomo slice 39/77.0]
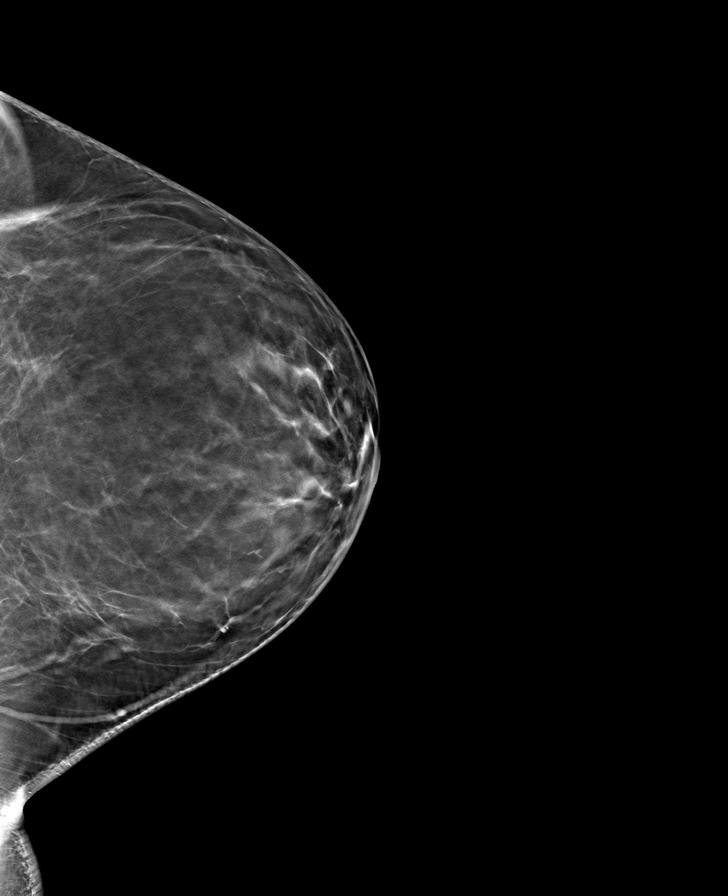

[8 of 24 positions shown; findings below may reference images not displayed]

ACR Breast Density Category b: There are scattered areas of
fibroglandular density.
FINDINGS: There are no findings suspicious for malignancy. The images were
evaluated with computer-aided detection.
IMPRESSION: No mammographic evidence of malignancy. A result letter of this
screening mammogram will be mailed directly to the patient.

RECOMMENDATION:
Screening mammogram in one year. (Code:C7-6-ASJ)

BI-RADS CATEGORY  1: Negative.

## 2022-01-10 ENCOUNTER — Ambulatory Visit (INDEPENDENT_AMBULATORY_CARE_PROVIDER_SITE_OTHER): Payer: Managed Care, Other (non HMO) | Admitting: Family

## 2022-01-10 ENCOUNTER — Encounter: Payer: Self-pay | Admitting: Family

## 2022-01-10 VITALS — BP 123/73 | HR 78 | Temp 98.3°F | Resp 16 | Ht 61.0 in | Wt 214.0 lb

## 2022-01-10 DIAGNOSIS — Z0000 Encounter for general adult medical examination without abnormal findings: Secondary | ICD-10-CM | POA: Diagnosis not present

## 2022-01-10 DIAGNOSIS — R739 Hyperglycemia, unspecified: Secondary | ICD-10-CM | POA: Diagnosis not present

## 2022-01-10 MED ORDER — VARENICLINE TARTRATE 0.5 MG X 11 & 1 MG X 42 PO TBPK: ORAL_TABLET | ORAL | 0 refills | Status: DC

## 2022-01-10 MED ORDER — NYSTATIN 100000 UNIT/GM EX POWD: 1.0000 "application " | Freq: Two times a day (BID) | CUTANEOUS | 1 refills | Status: DC

## 2022-01-10 NOTE — Assessment & Plan Note (Addendum)
Wt Readings from Last 3 Encounters:  ?01/10/22 214 lb (97.1 kg)  ?12/21/21 213 lb 6 oz (96.8 kg)  ?02/18/21 205 lb 2 oz (93 kg)  ? ?Working on diet, wants to try adding 30 minutes 5 days a week. Discussed smoking cessation.  Trial of chantix. Common side effects including rare risk of suicide ideation was discussed with the patient today.  Patient is instructed to go directly to the ED if this occurs.  We discussed that patient can continue to smoke for 1 week after starting chantix, but then must discontinue cigarettes.  He is also instructed to contact us prior to completion of the starter month pack for an rx for the continuation month pack.  ? ?Rx sent for nystatin powder to treat rash under breasts.  ?

## 2022-01-10 NOTE — Patient Instructions (Addendum)
Please schedule routine eye exam.  ?Please schedule pap and mammogram with GYN.  ?Please start chantix. ?Good luck quitting smoking.  ?

## 2022-01-10 NOTE — Progress Notes (Signed)
? ?Subjective:  ? ?By signing my name below, I, Carylon Perches, attest that this documentation has been prepared under the direction and in the presence of Debbrah Alar NP, 01/10/2022  ? ? Patient ID: Anita Cox, female    DOB: 08-Jan-1981, 41 y.o.   MRN: [id number] ? ?Chief Complaint  ?Patient presents with  ? Annual Exam  ?   ?  ? ? ?HPI ?Patient is in today for a comprehensive physical exam. ? ?Smoking - She smokes about a pack a day. She states that she is ready to quit. She is interested in starting Chantix. ? ?Environmental Allergies - She takes Zyrtec to help alleviate symptoms.  ? ?She denies having any fever, ear pain, new muscle pain, joint pain, new moles, congestion, sinus pain, sore throat, palpations, wheezing, n/v/d, constipation, blood in stool, dysuria, frequency, hematuria, headaches, depresssion or anxiety at this time. ? ?Social History: She reports no new surgeries. She denies of any changes to her family medical history.  ?Pap Smear: Last completed on 11/28/2019 ?Mammogram: Last completed on 12/23/2020 ?Immunizations: She has not received the COVID 19 Bivalent vaccine. She is not interested in a HIV/HepC screening.  ?Diet: She has been improving her diet. She has been cutting out fast food.  ?Exercise: She is not regularly exercising.  ?Dental: She is UTD on dental exams.  ?Vision: She is not UTD on vision exams.  ? ? ?Health Maintenance Due  ?Topic Date Due  ? COVID-19 Vaccine (4 - Booster for Pfizer series) 11/11/2020  ? ? ?Past Medical History:  ?Diagnosis Date  ? Acute bronchitis 12/27/2011  ? Anemia   ? Anxiety   ? Frequent UTI   ? Hay fever   ? with allergies  ? History of chicken pox   ? History of kidney stones   ? 07/2016  ? Pre-diabetes   ? ? ?Past Surgical History:  ?Procedure Laterality Date  ? NO PAST SURGERIES    ? denies surgical history  ? ? ?Family History  ?Problem Relation Age of Onset  ? Arthritis Mother   ? Drug abuse Mother   ? Diabetes Other   ?     grandmother  ?  Diabetes Paternal Grandfather   ? Diabetes Paternal Grandmother   ? Lupus Half-Sister   ? OCD Half-Sister   ? Bipolar disorder Half-Sister   ? ADD / ADHD Half-Sister   ? Anxiety disorder Half-Brother   ? Irritable bowel syndrome Half-Brother   ? Diabetes Mellitus II Maternal Grandmother   ? Urinary tract infection Maternal Grandmother   ? Alcohol abuse Maternal Grandfather   ? ? ?Social History  ? ?Socioeconomic History  ? Marital status: Married  ?  Spouse name: Not on file  ? Number of children: Not on file  ? Years of education: Not on file  ? Highest education level: Not on file  ?Occupational History  ? Not on file  ?Tobacco Use  ? Smoking status: Every Day  ?  Packs/day: 1.00  ?  Types: Cigarettes  ? Smokeless tobacco: Never  ?Vaping Use  ? Vaping Use: Never used  ?Substance and Sexual Activity  ? Alcohol use: Yes  ?  Comment: rare wine  ? Drug use: No  ? Sexual activity: Not Currently  ?Other Topics Concern  ? Not on file  ?Social History Narrative  ? 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 Raised by her maternal GM- mother has drug abuse issues  ? One adopted son (  biological nephew)  ? Married  ? 4 cats  ? Enjoys reading, crafting, television  ? Cleaning/organizing  ? ?Social Determinants of Health  ? ?Financial Resource Strain: Not on file  ?Food Insecurity: Not on file  ?Transportation Needs: Not on file  ?Physical Activity: Not on file  ?Stress: Not on file  ?Social Connections: Not on file  ?Intimate Partner Violence: Not on file  ? ? ?Outpatient Medications Prior to Visit  ?Medication Sig Dispense Refill  ? clonazePAM (KLONOPIN) 0.5 MG tablet Take 0.5 mg by mouth 2 (two) times daily as needed.    ? metFORMIN (GLUCOPHAGE-XR) 500 MG 24 hr tablet Take 500 mg by mouth daily with breakfast.     ? TRI-PREVIFEM 0.18/0.215/0.25 MG-35 MCG tablet Take 1 tablet by mouth daily.  11  ? valACYclovir (VALTREX) 1000 MG tablet Take 1 tablet (1,000 mg total) by mouth 3 (three) times daily. 21 tablet 0  ?  gabapentin (NEURONTIN) 100 MG capsule Take 1 capsule (100 mg total) by mouth 3 (three) times daily as needed. 45 capsule 1  ? hydrOXYzine (ATARAX/VISTARIL) 25 MG tablet 1 tab po q hs prn insomnia (Patient not taking: Reported on 12/21/2021) 14 tablet 0  ? meloxicam (MOBIC) 15 MG tablet TAKE 1 TABLET (15 MG TOTAL) BY MOUTH DAILY. (Patient not taking: Reported on 12/21/2021) 30 tablet 0  ? ?No facility-administered medications prior to visit.  ? ? ?No Known Allergies ? ?Review of Systems  ?Constitutional:  Negative for fever.  ?HENT:  Negative for congestion, ear pain, sinus pain and sore throat.   ?Respiratory:  Negative for wheezing.   ?Cardiovascular:  Negative for palpitations.  ?Gastrointestinal:  Negative for blood in stool, constipation, diarrhea, nausea and vomiting.  ?Genitourinary:  Negative for dysuria, frequency and hematuria.  ?Musculoskeletal:  Negative for joint pain and myalgias.  ?Skin:   ?     (-) New Moles  ?Neurological:  Negative for headaches.  ?Endo/Heme/Allergies:  Positive for environmental allergies.  ?Psychiatric/Behavioral:  Negative for depression. The patient is not nervous/anxious.   ? ?   ?Objective:  ?  ?Physical Exam ?Constitutional:   ?   General: She is not in acute distress. ?   Appearance: Normal appearance. She is not ill-appearing.  ?HENT:  ?   Head: Normocephalic and atraumatic.  ?   Right Ear: Tympanic membrane, ear canal and external ear normal.  ?   Left Ear: Tympanic membrane, ear canal and external ear normal.  ?   Mouth/Throat:  ?   Pharynx: No oropharyngeal exudate.  ?Eyes:  ?   Extraocular Movements: Extraocular movements intact.  ?   Pupils: Pupils are equal, round, and reactive to light.  ?Cardiovascular:  ?   Rate and Rhythm: Normal rate and regular rhythm.  ?   Heart sounds: Normal heart sounds. No murmur heard. ?  No gallop.  ?Pulmonary:  ?   Effort: Pulmonary effort is normal. No respiratory distress.  ?   Breath sounds: Normal breath sounds. No wheezing or rales.   ?Chest:  ?Breasts: ?   Breasts are symmetrical.  ?   Right: No inverted nipple or mass.  ?   Left: No inverted nipple or mass.  ?Abdominal:  ?   General: Bowel sounds are normal. There is no distension.  ?   Palpations: Abdomen is soft.  ?   Tenderness: There is no abdominal tenderness. There is no guarding.  ?Musculoskeletal:  ?   Comments: 5/5 strength in both upper and lower extremities  ?  Skin: ?   General: Skin is warm and dry.  ?   Findings: Rash (Under Bilateral Breasts) present.  ?Neurological:  ?   Mental Status: She is alert and oriented to person, place, and time.  ?   Deep Tendon Reflexes:  ?   Reflex Scores: ?     Patellar reflexes are 2+ on the right side and 2+ on the left side. ?Psychiatric:     ?   Mood and Affect: Mood normal.     ?   Behavior: Behavior normal.     ?   Judgment: Judgment normal.  ? ? ?BP 123/73 (BP Location: Right Arm, Patient Position: Sitting, Cuff Size: Large)   Pulse 78   Temp 98.3 ?F (36.8 ?C) (Oral)   Resp 16   Ht _0  (1.549 m)   Wt 214 lb (97.1 kg)   SpO2 98%   BMI 40.43 kg/m?  ?Wt Readings from Last 3 Encounters:  ?01/10/22 214 lb (97.1 kg)  ?12/21/21 213 lb 6 oz (96.8 kg)  ?02/18/21 205 lb 2 oz (93 kg)  ? ? ?   ?Assessment & Plan:  ? ?Problem List Items Addressed This Visit   ? ?  ? Unprioritized  ? Preventative health care - Primary  ?  Wt Readings from Last 3 Encounters:  ?01/10/22 214 lb (97.1 kg)  ?12/21/21 213 lb 6 oz (96.8 kg)  ?02/18/21 205 lb 2 oz (93 kg)  ? ?Working on diet, wants to try adding 30 minutes 5 days a week. Discussed smoking cessation.  Trial of chantix. Common side effects including rare risk of suicide ideation was discussed with the patient today.  Patient is instructed to go directly to the ED if this occurs.  We discussed that patient can continue to smoke for 1 week after starting chantix, but then must discontinue cigarettes.  He is also instructed to contact us prior to completion of the starter month pack for an rx for the  continuation month pack.  ? ?Rx sent for nystatin powder to treat rash under breasts.  ?  ?  ? ?Other Visit Diagnoses   ? ? Hyperglycemia      ? Relevant Orders  ? Comp Met (CMET)  ? Hemoglobin A1c  ? ?  ? ? ? ? ?Meds ordered t

## 2022-01-10 NOTE — Progress Notes (Deleted)
? ?Subjective:  ? ?By signing my name below, I, Carylon Perches, attest that this documentation has been prepared under the direction and in the presence of Debbrah Alar NP, 01/10/2022  ? ? Patient ID: Anita Cox, female    DOB: 08-Jan-1981, 41 y.o.   MRN: [id number] ? ?Chief Complaint  ?Patient presents with  ? Annual Exam  ?   ?  ? ? ?HPI ?Patient is in today for a comprehensive physical exam. ? ?Smoking - She smokes about a pack a day. She states that she is ready to quit. She is interested in starting Chantix. ? ?Environmental Allergies - She takes Zyrtec to help alleviate symptoms.  ? ?She denies having any fever, ear pain, new muscle pain, joint pain, new moles, congestion, sinus pain, sore throat, palpations, wheezing, n/v/d, constipation, blood in stool, dysuria, frequency, hematuria, headaches, depresssion or anxiety at this time. ? ?Social History: She reports no new surgeries. She denies of any changes to her family medical history.  ?Pap Smear: Last completed on 11/28/2019 ?Mammogram: Last completed on 12/23/2020 ?Immunizations: She has not received the COVID 19 Bivalent vaccine. She is not interested in a HIV/HepC screening.  ?Diet: She has been improving her diet. She has been cutting out fast food.  ?Exercise: She is not regularly exercising.  ?Dental: She is UTD on dental exams.  ?Vision: She is not UTD on vision exams.  ? ? ?Health Maintenance Due  ?Topic Date Due  ? COVID-19 Vaccine (4 - Booster for Pfizer series) 11/11/2020  ? ? ?Past Medical History:  ?Diagnosis Date  ? Acute bronchitis 12/27/2011  ? Anemia   ? Anxiety   ? Frequent UTI   ? Hay fever   ? with allergies  ? History of chicken pox   ? History of kidney stones   ? 07/2016  ? Pre-diabetes   ? ? ?Past Surgical History:  ?Procedure Laterality Date  ? NO PAST SURGERIES    ? denies surgical history  ? ? ?Family History  ?Problem Relation Age of Onset  ? Arthritis Mother   ? Drug abuse Mother   ? Diabetes Other   ?     grandmother  ?  Diabetes Paternal Grandfather   ? Diabetes Paternal Grandmother   ? Lupus Half-Sister   ? OCD Half-Sister   ? Bipolar disorder Half-Sister   ? ADD / ADHD Half-Sister   ? Anxiety disorder Half-Brother   ? Irritable bowel syndrome Half-Brother   ? Diabetes Mellitus II Maternal Grandmother   ? Urinary tract infection Maternal Grandmother   ? Alcohol abuse Maternal Grandfather   ? ? ?Social History  ? ?Socioeconomic History  ? Marital status: Married  ?  Spouse name: Not on file  ? Number of children: Not on file  ? Years of education: Not on file  ? Highest education level: Not on file  ?Occupational History  ? Not on file  ?Tobacco Use  ? Smoking status: Every Day  ?  Packs/day: 1.00  ?  Types: Cigarettes  ? Smokeless tobacco: Never  ?Vaping Use  ? Vaping Use: Never used  ?Substance and Sexual Activity  ? Alcohol use: Yes  ?  Comment: rare wine  ? Drug use: No  ? Sexual activity: Not Currently  ?Other Topics Concern  ? Not on file  ?Social History Narrative  ? 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 Raised by her maternal GM- mother has drug abuse issues  ? One adopted son (  biological nephew)  ? Married  ? 4 cats  ? Enjoys reading, crafting, television  ? Cleaning/organizing  ? ?Social Determinants of Health  ? ?Financial Resource Strain: Not on file  ?Food Insecurity: Not on file  ?Transportation Needs: Not on file  ?Physical Activity: Not on file  ?Stress: Not on file  ?Social Connections: Not on file  ?Intimate Partner Violence: Not on file  ? ? ?Outpatient Medications Prior to Visit  ?Medication Sig Dispense Refill  ? clonazePAM (KLONOPIN) 0.5 MG tablet Take 0.5 mg by mouth 2 (two) times daily as needed.    ? metFORMIN (GLUCOPHAGE-XR) 500 MG 24 hr tablet Take 500 mg by mouth daily with breakfast.     ? TRI-PREVIFEM 0.18/0.215/0.25 MG-35 MCG tablet Take 1 tablet by mouth daily.  11  ? valACYclovir (VALTREX) 1000 MG tablet Take 1 tablet (1,000 mg total) by mouth 3 (three) times daily. 21 tablet 0  ?  gabapentin (NEURONTIN) 100 MG capsule Take 1 capsule (100 mg total) by mouth 3 (three) times daily as needed. 45 capsule 1  ? hydrOXYzine (ATARAX/VISTARIL) 25 MG tablet 1 tab po q hs prn insomnia (Patient not taking: Reported on 12/21/2021) 14 tablet 0  ? meloxicam (MOBIC) 15 MG tablet TAKE 1 TABLET (15 MG TOTAL) BY MOUTH DAILY. (Patient not taking: Reported on 12/21/2021) 30 tablet 0  ? ?No facility-administered medications prior to visit.  ? ? ?No Known Allergies ? ?Review of Systems  ?Constitutional:  Negative for fever.  ?HENT:  Negative for congestion, ear pain, sinus pain and sore throat.   ?Respiratory:  Negative for wheezing.   ?Cardiovascular:  Negative for palpitations.  ?Gastrointestinal:  Negative for blood in stool, constipation, diarrhea, nausea and vomiting.  ?Genitourinary:  Negative for dysuria, frequency and hematuria.  ?Musculoskeletal:  Negative for joint pain and myalgias.  ?Skin:   ?     (-) New Moles  ?Neurological:  Negative for headaches.  ?Endo/Heme/Allergies:  Positive for environmental allergies.  ?Psychiatric/Behavioral:  Negative for depression. The patient is not nervous/anxious.   ? ?   ?Objective:  ?  ?Physical Exam ?Constitutional:   ?   General: She is not in acute distress. ?   Appearance: Normal appearance. She is not ill-appearing.  ?HENT:  ?   Head: Normocephalic and atraumatic.  ?   Right Ear: Tympanic membrane, ear canal and external ear normal.  ?   Left Ear: Tympanic membrane, ear canal and external ear normal.  ?   Mouth/Throat:  ?   Pharynx: No oropharyngeal exudate.  ?Eyes:  ?   Extraocular Movements: Extraocular movements intact.  ?   Pupils: Pupils are equal, round, and reactive to light.  ?Cardiovascular:  ?   Rate and Rhythm: Normal rate and regular rhythm.  ?   Heart sounds: Normal heart sounds. No murmur heard. ?  No gallop.  ?Pulmonary:  ?   Effort: Pulmonary effort is normal. No respiratory distress.  ?   Breath sounds: Normal breath sounds. No wheezing or rales.   ?Chest:  ?Breasts: ?   Breasts are symmetrical.  ?   Right: No inverted nipple or mass.  ?   Left: No inverted nipple or mass.  ?Abdominal:  ?   General: Bowel sounds are normal. There is no distension.  ?   Palpations: Abdomen is soft.  ?   Tenderness: There is no abdominal tenderness. There is no guarding.  ?Musculoskeletal:  ?   Comments: 5/5 strength in both upper and lower extremities  ?  Skin: ?   General: Skin is warm and dry.  ?   Findings: Rash (Under Bilateral Breasts) present.  ?Neurological:  ?   Mental Status: She is alert and oriented to person, place, and time.  ?   Deep Tendon Reflexes:  ?   Reflex Scores: ?     Patellar reflexes are 2+ on the right side and 2+ on the left side. ?Psychiatric:     ?   Mood and Affect: Mood normal.     ?   Behavior: Behavior normal.     ?   Judgment: Judgment normal.  ? ? ?BP 123/73 (BP Location: Right Arm, Patient Position: Sitting, Cuff Size: Large)   Pulse 78   Temp 98.3 ?F (36.8 ?C) (Oral)   Resp 16   Ht _0  (1.549 m)   Wt 214 lb (97.1 kg)   SpO2 98%   BMI 40.43 kg/m?  ?Wt Readings from Last 3 Encounters:  ?01/10/22 214 lb (97.1 kg)  ?12/21/21 213 lb 6 oz (96.8 kg)  ?02/18/21 205 lb 2 oz (93 kg)  ? ? ?   ?Assessment & Plan:  ? ?Problem List Items Addressed This Visit   ? ?  ? Unprioritized  ? Preventative health care - Primary  ?  Wt Readings from Last 3 Encounters:  ?01/10/22 214 lb (97.1 kg)  ?12/21/21 213 lb 6 oz (96.8 kg)  ?02/18/21 205 lb 2 oz (93 kg)  ?Working on diet, wants to try adding 30 minutes 5 days a week. Discussed smoking cessation.  Trial of chantix. Common side effects including rare risk of suicide ideation was discussed with the patient today.  Patient is instructed to go directly to the ED if this occurs.  We discussed that patient can continue to smoke for 1 week after starting chantix, but then must discontinue cigarettes.  He is also instructed to contact us prior to completion of the starter month pack for an rx for the continuation  month pack.  ? ?Rx sent for nystatin powder to treat rash under breasts.  ?  ?  ? ?Other Visit Diagnoses   ? ? Hyperglycemia      ? Relevant Orders  ? Comp Met (CMET)  ? Hemoglobin A1c  ? ?  ? ? ? ? ?Meds ordered thi

## 2022-01-11 LAB — COMPREHENSIVE METABOLIC PANEL
ALT: 17 U/L (ref 0–35)
AST: 17 U/L (ref 0–37)
Albumin: 4.2 g/dL (ref 3.5–5.2)
Alkaline Phosphatase: 58 U/L (ref 39–117)
BUN: 14 mg/dL (ref 6–23)
CO2: 25 mEq/L (ref 19–32)
Calcium: 9 mg/dL (ref 8.4–10.5)
Chloride: 102 mEq/L (ref 96–112)
Creatinine, Ser: 0.68 mg/dL (ref 0.40–1.20)
GFR: 107.69 mL/min (ref >60.00)
Glucose, Bld: 90 mg/dL (ref 70–99)
Potassium: 4.2 mEq/L (ref 3.5–5.1)
Sodium: 135 mEq/L (ref 135–145)
Total Bilirubin: 0.2 mg/dL (ref 0.2–1.2)
Total Protein: 7.3 g/dL (ref 6.0–8.3)

## 2022-01-11 LAB — HEMOGLOBIN A1C: Hgb A1c MFr Bld: 5.9 % (ref 4.6–6.5)

## 2022-02-07 ENCOUNTER — Ambulatory Visit: Payer: Managed Care, Other (non HMO) | Admitting: Family

## 2022-03-22 ENCOUNTER — Telehealth: Payer: Managed Care, Other (non HMO) | Admitting: Family

## 2022-03-22 ENCOUNTER — Encounter: Payer: Self-pay | Admitting: Family

## 2022-03-22 DIAGNOSIS — Z87891 Personal history of nicotine dependence: Secondary | ICD-10-CM | POA: Diagnosis not present

## 2022-03-22 DIAGNOSIS — F32A Depression, unspecified: Secondary | ICD-10-CM | POA: Diagnosis not present

## 2022-03-22 MED ORDER — FLUOXETINE HCL 20 MG PO TABS: ORAL_TABLET | ORAL | 0 refills | Status: DC

## 2022-03-22 NOTE — Assessment & Plan Note (Signed)
Recently quit smoking.  I commended pt on this recent success.

## 2022-03-22 NOTE — Assessment & Plan Note (Addendum)
    03/22/2022   12:08 PM 01/10/2022    2:54 PM 12/21/2021    3:44 PM 09/28/2020    9:13 AM 07/11/2016   10:43 AM  Depression screen PHQ 2/9  Decreased Interest 2 0 0 0 0  Down, Depressed, Hopeless 3 0 0 0 0  PHQ - 2 Score 5 0 0 0 0  Altered sleeping 3      Tired, decreased energy 3      Change in appetite 1      Feeling bad or failure about yourself  3      Trouble concentrating 1      Moving slowly or fidgety/restless 1      Suicidal thoughts 0      PHQ-9 Score 17      Difficult doing work/chores Somewhat difficult       Uncontrolled. Will rx with prozac 20mg  tabs. 1/2 tab one daily for 1 week, then increase to a full tablet once daily on week two.  We discussed common side effects such as nausea, drowsiness and weight gain.  Also discussed rare but serious side effect of suicide ideation.  She is instructed to discontinue medication go directly to ED if this occurs.  Pt verbalizes understanding.  Plan follow up in 1 month to evaluate progress.

## 2022-03-22 NOTE — Progress Notes (Signed)
MyChart Video Visit    Virtual Visit via Video Note   This visit type was conducted due to national recommendations for restrictions regarding the COVID-19 Pandemic (e.g. social distancing) in an effort to limit this patient's exposure and mitigate transmission in our community. This patient is at least at moderate risk for complications without adequate follow up. This format is felt to be most appropriate for this patient at this time. Physical exam was limited by quality of the video and audio technology used for the visit. CMA was able to get the patient set up on a video visit.  Patient location: Home Patient and provider in visit Provider location: Office  I discussed the limitations of evaluation and management by telemedicine and the availability of in person appointments. The patient expressed understanding and agreed to proceed.  Visit Date: 03/22/2022  Today's healthcare provider: Lemont Fillers, NP     Subjective:    Patient ID: Anita Cox, female    DOB: 04-Jun-1981, 41 y.o.   MRN: [id number]  Chief Complaint  Patient presents with   Depression    Reports depression symptoms, PHQ9 completed.     HPI Patient is in today for a follow up virtual visit.  Her depression has worsened. She notes it has been worsening for the past couple of years. She quit smoking cigarettes on Jan 29, 2022 and reports no relapsing since. Her husband continues smoking. She thinks her depression symptoms have worsened since. She has more fatigue and is sleeping in longer since she stopped smoking. She also has less tolerance at work. She also thinks her symptoms could be from increased stress due to selling her house. She has taken generic klonopin in the past to manage her anxiety but has never taken medication for depression. She is currently searching for a counselor but is finding it too expensive since it is not covered by her health insurance.   Past Medical History:   Diagnosis Date   Acute bronchitis 12/27/2011   Anemia    Anxiety    Frequent UTI    Hay fever    with allergies   History of chicken pox    History of kidney stones    07/2016   Pre-diabetes     Past Surgical History:  Procedure Laterality Date   NO PAST SURGERIES     denies surgical history    Family History  Problem Relation Age of Onset   Arthritis Mother    Drug abuse Mother    Diabetes Other        grandmother   Diabetes Paternal Grandfather    Diabetes Paternal Grandmother    Lupus Half-Sister    OCD Half-Sister    Bipolar disorder Half-Sister    ADD / ADHD Half-Sister    Anxiety disorder Half-Brother    Irritable bowel syndrome Half-Brother    Diabetes Mellitus II Maternal Grandmother    Urinary tract infection Maternal Grandmother    Alcohol abuse Maternal Grandfather     Social History   Socioeconomic History   Marital status: Married    Spouse name: Not on file   Number of children: Not on file   Years of education: Not on file   Highest education level: Not on file  Occupational History   Not on file  Tobacco Use   Smoking status: Former    Packs/day: 1.00    Types: Cigarettes    Quit date: 01/2022    Years since quitting: 0.1  Smokeless tobacco: Never  Vaping Use   Vaping Use: Never used  Substance and Sexual Activity   Alcohol use: Yes    Comment: rare wine   Drug use: No   Sexual activity: Not Currently  Other Topics Concern   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   Raised by her maternal GM- mother has drug abuse issues   One adopted son (biological nephew)   Married   4 cats   Enjoys reading, Clinical cytogeneticist, Horticulturist, commercial   Social Determinants of Health   Financial Resource Strain: Not on file  Food Insecurity: Not on file  Transportation Needs: Not on file  Physical Activity: Not on file  Stress: Not on file  Social Connections: Not on file  Intimate Partner  Violence: Not on file    Outpatient Medications Prior to Visit  Medication Sig Dispense Refill   clonazePAM (KLONOPIN) 0.5 MG tablet Take 0.5 mg by mouth 2 (two) times daily as needed.     metFORMIN (GLUCOPHAGE-XR) 500 MG 24 hr tablet Take 500 mg by mouth daily with breakfast.      nystatin powder Apply 1 application. topically 2 (two) times daily. 60 g 1   TRI-PREVIFEM 0.18/0.215/0.25 MG-35 MCG tablet Take 1 tablet by mouth daily.  11   valACYclovir (VALTREX) 1000 MG tablet Take 1 tablet (1,000 mg total) by mouth 3 (three) times daily. 21 tablet 0   varenicline (CHANTIX PAK) 0.5 MG X 11 & 1 MG X 42 tablet Take one 0.5 mg tablet by mouth once daily for 3 days, then increase to one 0.5 mg tablet twice daily for 4 days, then increase to one 1 mg tablet twice daily. 53 tablet 0   No facility-administered medications prior to visit.    No Known Allergies  Review of Systems  Psychiatric/Behavioral:  Positive for depression.        Objective:    Physical Exam  There were no vitals taken for this visit. Wt Readings from Last 3 Encounters:  01/10/22 214 lb (97.1 kg)  12/21/21 213 lb 6 oz (96.8 kg)  02/18/21 205 lb 2 oz (93 kg)    Gen: Awake, alert, no acute distress Resp: Breathing is even and non-labored Psych: calm/pleasant demeanor, slightly flattened affect Neuro: Alert and Oriented x 3, + facial symmetry, speech is clear.    Assessment & Plan:   Problem List Items Addressed This Visit       Unprioritized   History of tobacco abuse    Recently quit smoking.  I commended pt on this recent success.       Depression       03/22/2022   12:08 PM 01/10/2022    2:54 PM 12/21/2021    3:44 PM 09/28/2020    9:13 AM 07/11/2016   10:43 AM  Depression screen PHQ 2/9  Decreased Interest 2 0 0 0 0  Down, Depressed, Hopeless 3 0 0 0 0  PHQ - 2 Score 5 0 0 0 0  Altered sleeping 3      Tired, decreased energy 3      Change in appetite 1      Feeling bad or failure about yourself  3       Trouble concentrating 1      Moving slowly or fidgety/restless 1      Suicidal thoughts 0      PHQ-9 Score 17  Difficult doing work/chores Somewhat difficult      Uncontrolled. Will rx with prozac 20mg  tabs. 1/2 tab one daily for 1 week, then increase to a full tablet once daily on week two.  We discussed common side effects such as nausea, drowsiness and weight gain.  Also discussed rare but serious side effect of suicide ideation.  She is instructed to discontinue medication go directly to ED if this occurs.  Pt verbalizes understanding.  Plan follow up in 1 month to evaluate progress.          Relevant Medications   FLUoxetine (PROZAC) 20 MG tablet      Meds ordered this encounter  Medications   FLUoxetine (PROZAC) 20 MG tablet    Sig: 1/2 tablet by mouth once daily for 1 week, then increase to a full tab once daily on week two    Dispense:  30 tablet    Refill:  0    Order Specific Question:   Supervising Provider    Answer:   A [[id number]]    I discussed the assessment and treatment plan with the patient. The patient was provided an opportunity to ask questions and all were answered. The patient agreed with the plan and demonstrated an understanding of the instructions.   The patient was advised to call back or seek an in-person evaluation if the symptoms worsen or if the condition fails to improve as anticipated.  I provided 20 minutes of face-to-face time during this encounter.   I,Shehryar Danise Edge as a Garment/textile technologist for Neurosurgeon, NP.,have documented all relevant documentation on the behalf of [employer], NP,as directed by  Lemont Fillers, NP while in the presence of Lemont Fillers, NP.    Lemont Fillers, NP Lemont Fillers at [facility] [phone] (phone) [phone] (fax)  [facility]

## 2022-03-27 ENCOUNTER — Telehealth: Payer: Self-pay | Admitting: Family

## 2022-03-27 NOTE — Telephone Encounter (Signed)
Nurse Assessment Nurse: Gasper Sells, RN, [name] Date/Time Anita Cox Time): 03/27/2022 9:51:34 AM Confirm and document reason for call. If symptomatic, describe symptoms. ---Caller states she has been having dizzy spells since yesterday. Started taking Prozac 5 days ago(10mg  for a week then to 20mg ). No other s/s. Worked packing up house. Was thirsty. Does the patient have any new or worsening symptoms? ---Yes Will a triage be completed? ---Yes Related visit to physician within the last 2 weeks? ---Yes Does the PT have any chronic conditions? (i.e. diabetes, asthma, this includes High risk factors for pregnancy, etc.) ---Yes List chronic conditions. ---Depression, anxiety, Is the patient pregnant or possibly pregnant? (Ask all females between the ages of 66-55) ---No Is this a behavioral health or substance abuse call? ---No Guidelines Guideline Title Affirmed Question Affirmed Notes Nurse Date/Time (Eastern Time) Dizziness - Lightheadedness Taking a medicine that could cause dizziness (e.g., blood pressure medications, diuretics) 14-58, RN, Gasper Sells 03/27/2022 9:55:46 AM PLEASE NOTE: All timestamps contained within this report are represented as 05/28/2022 Standard Time. CONFIDENTIALTY NOTICE: This fax transmission is intended only for the addressee. It contains information that is legally privileged, confidential or otherwise protected from use or disclosure. If you are not the intended recipient, you are strictly prohibited from reviewing, disclosing, copying using or disseminating any of this information or taking any action in reliance on or regarding this information. If you have received this fax in error, please notify [place] immediately by telephone so that we can arrange for its return to [place]. Phone: [phone], Toll-Free: [phone], Fax: [phone] Page: 2 of 2 Call Id: [phone] Disp. Time [id number] Time) Disposition Final User 03/27/2022 9:36:45 AM Attempt made - message  left 05/28/2022 03/27/2022 9:59:47 AM See PCP within 24 Hours Yes 05/28/2022, RN, Gasper Sells Final Disposition 03/27/2022 9:59:47 AM See PCP within 24 Hours Yes 05/28/2022, RN, Gasper Sells Disagree/Comply Comply Caller Understands Yes PreDisposition Call Doctor Care Advice Given Per Guideline SEE PCP WITHIN 24 HOURS: * IF OFFICE WILL BE OPEN: You need to be examined within the next 24 hours. Call your doctor (or NP/PA) when the office opens and make an appointment. MEDICINE AS A CAUSE: * Your medicine may or may not be causing your dizziness. Your doctor can help you decide. * If the medicine is essential (most prescription meds), continue it. * If you're not sure, continue it. DRINK FLUIDS: * Drink several glasses of fruit juice, other clear fluids or water. * This will improve hydration and blood glucose. * If the weather is hot or you have a fever, make sure the fluids are cold. LIE DOWN AND REST: * Lie down with feet elevated for 1 hour. * This will improve circulation and increase blood flow to the brain. COOL OFF: * If the weather is hot, apply a cold compress to the forehead. * Or take a cool shower or bath. CALL BACK IF: * Passes out (faints) * You become worse CARE ADVICE given per Dizziness (Adult) guideline. Comments User: Herbert Pun, RN Date/Time Jason Coop Time): 03/27/2022 10:02:49 AM Pt advised to call back for appt for within 24 hours. She is at work. Advised to make appt to evaluate if anything more is going on. Advised moving slowly. Get up and down easily. Lots of liquids as Prozac can lower BP at first until she gets used to it. May eat 05/28/2022 food, V8 or tomato juice, broth for salt to help equalize BP. Pt was moving and was not drinking much. Woke this morning feeling better. Uv Referrals  REFERRED TO PCP OFFICE

## 2022-03-27 NOTE — Telephone Encounter (Signed)
Pt was triaged for dizziness. She thinks new medication may pay a role.

## 2022-03-27 NOTE — Telephone Encounter (Signed)
Lvm for patient to call back for possible appointment.

## 2022-03-28 NOTE — Telephone Encounter (Signed)
Left detailed message for patient to call back if appointment was needed.

## 2022-04-03 ENCOUNTER — Encounter: Payer: Self-pay | Admitting: Family

## 2022-04-03 ENCOUNTER — Other Ambulatory Visit (HOSPITAL_BASED_OUTPATIENT_CLINIC_OR_DEPARTMENT_OTHER): Payer: Self-pay

## 2022-04-03 MED ORDER — DULOXETINE HCL 30 MG PO CPEP
ORAL_CAPSULE | ORAL | 0 refills | Status: DC
  Filled 2022-04-03: qty 60, 30d supply, fill #0

## 2022-04-11 ENCOUNTER — Other Ambulatory Visit (HOSPITAL_BASED_OUTPATIENT_CLINIC_OR_DEPARTMENT_OTHER): Payer: Self-pay

## 2022-04-12 ENCOUNTER — Other Ambulatory Visit (HOSPITAL_BASED_OUTPATIENT_CLINIC_OR_DEPARTMENT_OTHER): Payer: Self-pay

## 2022-04-17 NOTE — Addendum Note (Signed)
Addended by: Sandford Craze on: 04/17/2022 08:57 AM   Modules accepted: Orders

## 2022-04-19 ENCOUNTER — Other Ambulatory Visit: Payer: Self-pay | Admitting: Family

## 2022-04-19 DIAGNOSIS — F32A Depression, unspecified: Secondary | ICD-10-CM

## 2022-04-24 ENCOUNTER — Other Ambulatory Visit: Payer: Self-pay | Admitting: Family

## 2022-04-24 DIAGNOSIS — F32A Depression, unspecified: Secondary | ICD-10-CM

## 2022-04-24 MED ORDER — FLUOXETINE HCL 20 MG PO CAPS: 20.0000 mg | ORAL_CAPSULE | Freq: Every day | ORAL | 0 refills | Status: DC

## 2022-04-24 NOTE — Addendum Note (Signed)
Addended by: Sandford Craze on: 04/24/2022 11:45 AM   Modules accepted: Orders

## 2022-04-27 NOTE — Addendum Note (Signed)
Addended by: Sandford Craze on: 04/27/2022 01:09 PM   Modules accepted: Orders

## 2022-05-09 ENCOUNTER — Telehealth (INDEPENDENT_AMBULATORY_CARE_PROVIDER_SITE_OTHER): Payer: Managed Care, Other (non HMO) | Admitting: Family

## 2022-05-09 DIAGNOSIS — F32A Depression, unspecified: Secondary | ICD-10-CM

## 2022-05-09 DIAGNOSIS — R197 Diarrhea, unspecified: Secondary | ICD-10-CM | POA: Insufficient documentation

## 2022-05-09 DIAGNOSIS — Z87891 Personal history of nicotine dependence: Secondary | ICD-10-CM | POA: Diagnosis not present

## 2022-05-09 NOTE — Assessment & Plan Note (Signed)
Advised pt to remain off of metformin. Will obtain stool studies as below and refer to GI.  Recommended trial of prn imodium and trying an elimination diet (first with dairy and then with gluten) to see if this helps her symptoms at all.

## 2022-05-09 NOTE — Progress Notes (Signed)
MyChart Video Visit    Virtual Visit via Video Note   This visit type was conducted due to national recommendations for restrictions regarding the COVID-19 Pandemic (e.g. social distancing) in an effort to limit this patient's exposure and mitigate transmission in our community. This patient is at least at moderate risk for complications without adequate follow up. This format is felt to be most appropriate for this patient at this time. Physical exam was limited by quality of the video and audio technology used for the visit. CMA was able to get the patient set up on a video visit.  Patient location: Home. Patient and provider in visit Provider location: Office  I discussed the limitations of evaluation and management by telemedicine and the availability of in person appointments. The patient expressed understanding and agreed to proceed.  Visit Date: 05/09/2022  Today's healthcare provider: Lemont Fillers, NP     Subjective:    Patient ID: Anita Cox, female    DOB: 12-Jun-1981, 41 y.o.   MRN: [id number]  Chief Complaint  Patient presents with   Abdominal Pain    Patient complains of abdominal pain and diarrhea for about 3 months.     HPI  Depression- had dizziness/lightheadedness when she began prozac.  Reports that these symptoms have improved.  Reports that she has felt really frustrated and angry. Feeling so much better since she started taking.  She stopped metformin but she is still having stomach issues.  Diarrhea was present before she started prozac. Reports that she will have days of diarrhea, some cramping not severe.  Sometimes feels like "bricks at the bottom of her stomach." Feels ok today but felt badly over the weekend.    She has not smoked in 3.5 months.   Past Medical History:  Diagnosis Date   Acute bronchitis 12/27/2011   Anemia    Anxiety    Frequent UTI    Hay fever    with allergies   History of chicken pox    History of kidney  stones    07/2016   Pre-diabetes     Past Surgical History:  Procedure Laterality Date   NO PAST SURGERIES     denies surgical history    Family History  Problem Relation Age of Onset   Arthritis Mother    Drug abuse Mother    Diabetes Other        grandmother   Diabetes Paternal Grandfather    Diabetes Paternal Grandmother    Lupus Half-Sister    OCD Half-Sister    Bipolar disorder Half-Sister    ADD / ADHD Half-Sister    Anxiety disorder Half-Brother    Irritable bowel syndrome Half-Brother    Diabetes Mellitus II Maternal Grandmother    Urinary tract infection Maternal Grandmother    Alcohol abuse Maternal Grandfather     Social History   Socioeconomic History   Marital status: Married    Spouse name: Not on file   Number of children: Not on file   Years of education: Not on file   Highest education level: Not on file  Occupational History   Not on file  Tobacco Use   Smoking status: Former    Packs/day: 1.00    Types: Cigarettes    Quit date: 01/2022    Years since quitting: 0.3   Smokeless tobacco: Never  Vaping Use   Vaping Use: Never used  Substance and Sexual Activity   Alcohol use: Yes    Comment:  rare wine   Drug use: No   Sexual activity: Not Currently  Other Topics Concern   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   Raised by her maternal GM- mother has drug abuse issues   One adopted son (biological nephew)   Married   4 cats   Enjoys reading, Clinical cytogeneticist, Horticulturist, commercial   Social Determinants of Health   Financial Resource Strain: Not on file  Food Insecurity: Not on file  Transportation Needs: Not on file  Physical Activity: Not on file  Stress: Not on file  Social Connections: Not on file  Intimate Partner Violence: Not on file    Outpatient Medications Prior to Visit  Medication Sig Dispense Refill   clonazePAM (KLONOPIN) 0.5 MG tablet Take 0.5 mg by mouth 2 (two)  times daily as needed.     FLUoxetine (PROZAC) 20 MG capsule Take 1 capsule (20 mg total) by mouth daily. 30 capsule 0   nystatin powder Apply 1 application. topically 2 (two) times daily. 60 g 1   TRI-PREVIFEM 0.18/0.215/0.25 MG-35 MCG tablet Take 1 tablet by mouth daily.  11   valACYclovir (VALTREX) 1000 MG tablet Take 1 tablet (1,000 mg total) by mouth 3 (three) times daily. 21 tablet 0   varenicline (CHANTIX PAK) 0.5 MG X 11 & 1 MG X 42 tablet Take one 0.5 mg tablet by mouth once daily for 3 days, then increase to one 0.5 mg tablet twice daily for 4 days, then increase to one 1 mg tablet twice daily. 53 tablet 0   No facility-administered medications prior to visit.    No Known Allergies  ROS    See HPI Objective:    Physical Exam  There were no vitals taken for this visit. Wt Readings from Last 3 Encounters:  01/10/22 214 lb (97.1 kg)  12/21/21 213 lb 6 oz (96.8 kg)  02/18/21 205 lb 2 oz (93 kg)    Gen: Awake, alert, no acute distress Resp: Breathing is even and non-labored Psych: calm/pleasant demeanor Neuro: Alert and Oriented x 3, + facial symmetry, speech is clear.     Assessment & Plan:   Problem List Items Addressed This Visit       Unprioritized   History of tobacco abuse    I commended her on being smoke free!      Diarrhea - Primary    Advised pt to remain off of metformin. Will obtain stool studies as below and refer to GI.  Recommended trial of prn imodium and trying an elimination diet (first with dairy and then with gluten) to see if this helps her symptoms at all.       Relevant Orders   Ambulatory referral to Gastroenterology   Stool Culture   Ova and parasite examination   Clostridium Difficile by PCR([facility])   Depression    Much improved since she began prozac.         I have discontinued Anita Cox. Bergeron's valACYclovir and varenicline. I am also having her maintain her clonazePAM, Tri-Previfem, nystatin, and FLUoxetine.  No  orders of the defined types were placed in this encounter.   I discussed the assessment and treatment plan with the patient. The patient was provided an opportunity to ask questions and all were answered. The patient agreed with the plan and demonstrated an understanding of the instructions.   The patient was advised to call back or seek an in-person evaluation  if the symptoms worsen or if the condition fails to improve as anticipated.  Lemont Fillers, NP [employer] [facility] [phone] (phone) [phone] (fax)  [facility]

## 2022-05-09 NOTE — Assessment & Plan Note (Signed)
I commended her on being smoke free!

## 2022-05-09 NOTE — Assessment & Plan Note (Signed)
Much improved since she began prozac.

## 2022-05-17 ENCOUNTER — Other Ambulatory Visit: Payer: Self-pay | Admitting: Family

## 2022-05-30 ENCOUNTER — Encounter: Payer: Self-pay | Admitting: Family

## 2022-05-31 ENCOUNTER — Other Ambulatory Visit (HOSPITAL_BASED_OUTPATIENT_CLINIC_OR_DEPARTMENT_OTHER): Payer: Self-pay

## 2022-05-31 MED ORDER — ESCITALOPRAM OXALATE 10 MG PO TABS
10.0000 mg | ORAL_TABLET | Freq: Every day | ORAL | 1 refills | Status: DC
  Filled 2022-05-31: qty 30, 30d supply, fill #0

## 2022-05-31 MED ORDER — ESCITALOPRAM OXALATE 10 MG PO TABS: 10.0000 mg | ORAL_TABLET | Freq: Every day | ORAL | 1 refills | Status: DC

## 2022-05-31 NOTE — Addendum Note (Signed)
Addended by: Sandford Craze on: 05/31/2022 07:10 AM   Modules accepted: Orders

## 2022-05-31 NOTE — Addendum Note (Signed)
Addended byConrad Succasunna D on: 05/31/2022 11:29 AM   Modules accepted: Orders

## 2022-06-24 ENCOUNTER — Other Ambulatory Visit: Payer: Self-pay | Admitting: Family

## 2022-08-30 LAB — HM PAP SMEAR: HM Pap smear: NEGATIVE

## 2022-11-08 ENCOUNTER — Encounter: Payer: Self-pay | Admitting: Family

## 2022-11-08 ENCOUNTER — Ambulatory Visit: Payer: Managed Care, Other (non HMO) | Admitting: Family

## 2022-11-08 VITALS — BP 124/66 | HR 79 | Temp 98.0°F | Resp 16 | Wt 216.0 lb

## 2022-11-08 DIAGNOSIS — R21 Rash and other nonspecific skin eruption: Secondary | ICD-10-CM | POA: Insufficient documentation

## 2022-11-08 DIAGNOSIS — S39012A Strain of muscle, fascia and tendon of lower back, initial encounter: Secondary | ICD-10-CM | POA: Diagnosis not present

## 2022-11-08 DIAGNOSIS — J0100 Acute maxillary sinusitis, unspecified: Secondary | ICD-10-CM | POA: Insufficient documentation

## 2022-11-08 DIAGNOSIS — Z1231 Encounter for screening mammogram for malignant neoplasm of breast: Secondary | ICD-10-CM | POA: Diagnosis not present

## 2022-11-08 MED ORDER — CEPHALEXIN 500 MG PO CAPS: 500.0000 mg | ORAL_CAPSULE | Freq: Three times a day (TID) | ORAL | 0 refills | Status: DC

## 2022-11-08 MED ORDER — BETAMETHASONE VALERATE 0.1 % EX OINT: 1.0000 | TOPICAL_OINTMENT | Freq: Two times a day (BID) | CUTANEOUS | 0 refills | Status: DC

## 2022-11-08 MED ORDER — AMOXICILLIN-POT CLAVULANATE 875-125 MG PO TABS: 1.0000 | ORAL_TABLET | Freq: Two times a day (BID) | ORAL | 0 refills | Status: DC

## 2022-11-08 MED ORDER — MELOXICAM 7.5 MG PO TABS: 7.5000 mg | ORAL_TABLET | Freq: Every day | ORAL | 0 refills | Status: DC

## 2022-11-08 NOTE — Progress Notes (Signed)
Subjective:   By signing my name below, I, Anita Cox, attest that this documentation has been prepared under the direction and in the presence of Debbrah Alar, NP. 11/08/2022   Patient ID: Anita Cox, female    DOB: [birth year], 42 y.o.   MRN: [id number]  Chief Complaint  Patient presents with   Sinus Problem    Complains of sinus congestion, pressure and pain   Back Pain    Complains of lower back pain   Rash    Complains of facial rash on chin    HPI Patient is in today for an office visit.  Rash: She has a rash on her right chin. The rash itches periodically She tried acne medication which made it dry and crusty. She is now using a topical ointment on it but does not find relief from it.   Sinus: She has complained of sinus issues for at least 2 months but since Christmas symptoms have worsened. She complains of congestion, bloody nose on the left side, and headaches. Left maxillary sinus tenderness.    Back pain: During normal activities she has lower back pain. She has mild lumbar tenderness in her lower back.   Allergies: She does not have allergies to any antibiotics.    Past Medical History:  Diagnosis Date   Acute bronchitis 12/27/2011   Anemia    Anxiety    Frequent UTI    Hay fever    with allergies   History of chicken pox    History of kidney stones    07/2016   Pre-diabetes     Past Surgical History:  Procedure Laterality Date   NO PAST SURGERIES     denies surgical history    Family History  Problem Relation Age of Onset   Arthritis Mother    Drug abuse Mother    Diabetes Other        grandmother   Diabetes Paternal Grandfather    Diabetes Paternal Grandmother    Lupus Half-Sister    OCD Half-Sister    Bipolar disorder Half-Sister    ADD / ADHD Half-Sister    Anxiety disorder Half-Brother    Irritable bowel syndrome Half-Brother    Diabetes Mellitus II Maternal Grandmother    Urinary tract infection Maternal Grandmother     Alcohol abuse Maternal Grandfather     Social History   Socioeconomic History   Marital status: Married    Spouse name: Not on file   Number of children: Not on file   Years of education: Not on file   Highest education level: Not on file  Occupational History   Not on file  Tobacco Use   Smoking status: Former    Packs/day: 1.00    Types: Cigarettes    Quit date: 01/2022    Years since quitting: 0.8   Smokeless tobacco: Never  Vaping Use   Vaping Use: Never used  Substance and Sexual Activity   Alcohol use: Yes    Comment: rare wine   Drug use: No   Sexual activity: Not Currently  Other Topics Concern   Not on file  Social History Narrative   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     escitalopram (LEXAPRO) 10 MG tablet TAKE 1 TABLET BY MOUTH EVERY DAY 90 tablet 1   nystatin powder Apply 1 application. topically 2 (two) times daily. 60 g 1   TRI-PREVIFEM 0.18/0.215/0.25 MG-35 MCG tablet Take 1 tablet by mouth daily.  11   No facility-administered medications prior to visit.    No Known Allergies  Review of Systems  HENT:  Positive for congestion and nosebleeds (in left nostril).        (+) left maxillary sinus tenderness   Musculoskeletal:  Positive for back pain (lumbar).       (+) mild lumbar tenderness   Neurological:  Positive for headaches.        Objective:    Physical Exam Constitutional:      General: She is not in acute distress.    Appearance: Normal appearance. She is not toxic-appearing.  HENT:     Head: Normocephalic and atraumatic.     Right Ear: Tympanic membrane, ear canal and external ear normal.     Left Ear: Tympanic membrane, ear canal and external ear normal.     Mouth/Throat:     Mouth: Mucous membranes are moist.  Eyes:     Extraocular Movements: Extraocular movements intact.     Pupils: Pupils are equal, round, and reactive to light.  Cardiovascular:     Rate and Rhythm: Normal rate and regular rhythm.     Heart sounds: Normal heart sounds. No murmur heard.    No gallop.  Pulmonary:     Effort: Pulmonary effort is normal. No respiratory distress.     Breath sounds: Normal breath sounds. No wheezing or rales.  Musculoskeletal:     Lumbar back: Tenderness (mild) present.  Lymphadenopathy:     Cervical: No cervical adenopathy.  Skin:    General: Skin is warm.     Findings: Rash (right chin) present.  Neurological:     Mental Status: She is alert and oriented to person, place, and time.     Deep Tendon Reflexes:     Reflex Scores:      Patellar reflexes are 2+ on the right side and 2+ on the left side.    Comments: Bilateral LE strength is 5/5  Psychiatric:        Judgment: Judgment normal.     BP 124/66 (BP Location: Right Arm, Patient Position: Sitting, Cuff Size: Large)   Pulse 79   Temp 98 F (36.7 C) (Oral)   Resp 16   Wt 216 lb (98 kg)   SpO2 100%   BMI 40.81 kg/m  Wt Readings from Last 3 Encounters:  11/08/22 216 lb (98 kg)  01/10/22 214 lb (97.1 kg)  12/21/21 213 lb 6 oz (96.8 kg)       Assessment & Plan:  Strain of lumbar region, initial encounter Assessment & Plan: New. Trial of meloxicam. Pt also given back exercises to perform at home.    Breast cancer screening by mammogram -     3D Screening Mammogram, Left and Right; Future  Skin rash Assessment &  Plan: New.  Notes some  intermittent yellow crusting, suspect degree of bacterial infection (impetigo).  Will plan to rx with augmentin and topical betamethasone.    Acute maxillary sinusitis, recurrence not specified Assessment & Plan: New. Rx with augmentin.     Other orders -     Betamethasone Valerate; Apply 1 Application topically 2 (two) times daily.  Dispense: 30 g; Refill: 0 -     Amoxicillin-Pot Clavulanate; Take 1 tablet by mouth 2 (two) times daily.  Dispense: 20 tablet; Refill: 0 -     Meloxicam; Take 1 tablet (7.5 mg total) by mouth daily.  Dispense: 14 tablet; Refill: 0    I, Nance Pear, NP, personally preformed the services described in this documentation.  All medical record entries made by the scribe were at my direction and in my presence.  I have reviewed the chart and discharge instructions (if applicable) and agree that the record reflects my personal performance and is accurate and complete. 11/08/2022   Lacretia Leigh as a scribe for Nance Pear, NP.,have documented all relevant documentation on the behalf of Nance Pear, NP,as directed by  Nance Pear, NP while in the presence of Nance Pear, NP.   Nance Pear, NP

## 2022-11-08 NOTE — Patient Instructions (Signed)
Start augmentin for sinus infection. Apply betamethasone cream twice daily to rash. Begin meloxicam once daily for back pain and do exercises 1-2 times daily as able.

## 2022-11-08 NOTE — Assessment & Plan Note (Signed)
New. Rx with augmentin.

## 2022-11-08 NOTE — Assessment & Plan Note (Signed)
New.  Notes some intermittent yellow crusting, suspect degree of bacterial infection (impetigo).  Will plan to rx with augmentin and topical betamethasone.

## 2022-11-08 NOTE — Assessment & Plan Note (Signed)
New. Trial of meloxicam. Pt also given back exercises to perform at home.

## 2022-11-21 ENCOUNTER — Encounter: Payer: Self-pay | Admitting: Family

## 2022-11-21 DIAGNOSIS — J019 Acute sinusitis, unspecified: Secondary | ICD-10-CM

## 2022-11-22 ENCOUNTER — Ambulatory Visit: Payer: Managed Care, Other (non HMO) | Admitting: Family

## 2022-11-22 VITALS — BP 134/75 | HR 77 | Temp 98.0°F | Resp 16 | Wt 216.0 lb

## 2022-11-22 DIAGNOSIS — J0100 Acute maxillary sinusitis, unspecified: Secondary | ICD-10-CM | POA: Diagnosis not present

## 2022-11-22 MED ORDER — PREDNISONE 10 MG PO TABS: ORAL_TABLET | ORAL | 0 refills | Status: DC

## 2022-11-22 MED ORDER — DOXYCYCLINE HYCLATE 100 MG PO CAPS: 100.0000 mg | ORAL_CAPSULE | Freq: Two times a day (BID) | ORAL | 0 refills | Status: AC

## 2022-11-22 NOTE — Progress Notes (Signed)
Subjective:     Patient ID: Anita Cox, female    DOB: Jul 26, 1981, 42 y.o.   MRN: [id number]  Chief Complaint  Patient presents with   Sinus Problem    Complains of sinus pain on left side of face   Facial Swelling    Complains of swelling on left side of face and neck area   Flank Pain    Complains of left flank pain     Sinus Problem  Flank Pain   Patient is in today for follow up of sinusitis. She was treated on 2/21 with augmentin x 10 days.  She reports no improvement, in fact it has worsened, in the left cheek. Also notes some pressure behind her left eye and a swollen gland on her neck.    Notes resolution of skin rash since last visit.   Low back pain is improved overall.    Reports that she had some left sided abdominal pain on 3/4 which was followed by diarrhea. Pain is now improved   Health Maintenance Due  Topic Date Due   COVID-19 Vaccine (4 - 2023-24 season) 05/19/2022   PAP SMEAR-Modifier  11/28/2022    Past Medical History:  Diagnosis Date   Acute bronchitis 12/27/2011   Anemia    Anxiety    Frequent UTI    Hay fever    with allergies   History of chicken pox    History of kidney stones    07/2016   Pre-diabetes     Past Surgical History:  Procedure Laterality Date   NO PAST SURGERIES     denies surgical history    Family History  Problem Relation Age of Onset   Arthritis Mother    Drug abuse Mother    Diabetes Other        grandmother   Diabetes Paternal Grandfather    Diabetes Paternal Grandmother    Lupus Half-Sister    OCD Half-Sister    Bipolar disorder Half-Sister    ADD / ADHD Half-Sister    Anxiety disorder Half-Brother    Irritable bowel syndrome Half-Brother    Diabetes Mellitus II Maternal Grandmother    Urinary tract infection Maternal Grandmother    Alcohol abuse Maternal Grandfather     Social History   Socioeconomic History   Marital status: Married    Spouse name: Not on file   Number of children: Not  on file   Years of education: Not on file   Highest education level: Not on file  Occupational History   Not on file  Tobacco Use   Smoking status: Former    Packs/day: 1.00    Types: Cigarettes    Quit date: 01/2022    Years since quitting: 0.8   Smokeless tobacco: Never  Vaping Use   Vaping Use: Never used  Substance and Sexual Activity   Alcohol use: Yes    Comment: rare wine   Drug use: No   Sexual activity: Not Currently  Other Topics Concern   Not on file  Social History Narrative   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   Raised by her maternal GM- mother has drug abuse issues   One adopted son (biological nephew)   Married   4 cats   Enjoys reading, Barrister's clerk, Event organiser   Social Determinants of Health   Financial Resource Strain: Not on file  Food Insecurity: Not on file  Transportation Needs: Not on file  Physical Activity: Not  on file  Stress: Not on file  Social Connections: Not on file  Intimate Partner Violence: Not on file    Outpatient Medications Prior to Visit  Medication Sig Dispense Refill   betamethasone valerate ointment (VALISONE) 0.1 % Apply 1 Application topically 2 (two) times daily. 30 g 0   clonazePAM (KLONOPIN) 0.5 MG tablet Take 0.5 mg by mouth 2 (two) times daily as needed.     escitalopram (LEXAPRO) 10 MG tablet TAKE 1 TABLET BY MOUTH EVERY DAY 90 tablet 1   meloxicam (MOBIC) 7.5 MG tablet Take 1 tablet (7.5 mg total) by mouth daily. 14 tablet 0   TRI-PREVIFEM 0.18/0.215/0.25 MG-35 MCG tablet Take 1 tablet by mouth daily.  11   amoxicillin-clavulanate (AUGMENTIN) 875-125 MG tablet Take 1 tablet by mouth 2 (two) times daily. 20 tablet 0   nystatin powder Apply 1 application. topically 2 (two) times daily. 60 g 1   No facility-administered medications prior to visit.    No Known Allergies  Review of Systems  Genitourinary:  Positive for flank pain.       Objective:    Physical  Exam Constitutional:      General: She is not in acute distress.    Appearance: Normal appearance. She is well-developed.  HENT:     Head: Normocephalic and atraumatic.     Right Ear: Tympanic membrane, ear canal and external ear normal.     Left Ear: Tympanic membrane, ear canal and external ear normal.     Nose:     Right Sinus: No maxillary sinus tenderness or frontal sinus tenderness.     Left Sinus: Maxillary sinus tenderness and frontal sinus tenderness present.  Eyes:     General: No scleral icterus. Neck:     Thyroid: No thyromegaly.  Cardiovascular:     Rate and Rhythm: Normal rate and regular rhythm.     Heart sounds: Normal heart sounds. No murmur heard. Pulmonary:     Effort: Pulmonary effort is normal. No respiratory distress.     Breath sounds: Normal breath sounds. No wheezing.  Abdominal:     General: Bowel sounds are normal.     Palpations: Abdomen is soft.     Tenderness: There is no abdominal tenderness.  Musculoskeletal:     Cervical back: Neck supple.  Lymphadenopathy:     Cervical: No cervical adenopathy.  Skin:    General: Skin is warm and dry.  Neurological:     Mental Status: She is alert and oriented to person, place, and time.  Psychiatric:        Mood and Affect: Mood normal.        Behavior: Behavior normal.        Thought Content: Thought content normal.        Judgment: Judgment normal.     BP 134/75 (BP Location: Right Arm, Patient Position: Sitting, Cuff Size: Large)   Pulse 77   Temp 98 F (36.7 C) (Oral)   Resp 16   Wt 216 lb (98 kg)   SpO2 98%   BMI 40.81 kg/m  Wt Readings from Last 3 Encounters:  11/22/22 216 lb (98 kg)  11/08/22 216 lb (98 kg)  01/10/22 214 lb (97.1 kg)       Assessment & Plan:   Problem List Items Addressed This Visit       Unprioritized   Acute maxillary sinusitis - Primary    Unchanged despite 10 day course of augmentin.  Will retreat with doxycycline '100mg'$   bid x 10 days and a prednisone taper.  Pt is advised to call if symptoms worsen or if symptoms do not improve.       Relevant Medications   doxycycline (VIBRAMYCIN) 100 MG capsule   predniSONE (DELTASONE) 10 MG tablet    I have discontinued Denyce Robert. Finfrock's nystatin and amoxicillin-clavulanate. I am also having her start on doxycycline and predniSONE. Additionally, I am having her maintain her clonazePAM, Tri-Previfem, escitalopram, betamethasone valerate ointment, and meloxicam.  Meds ordered this encounter  Medications   doxycycline (VIBRAMYCIN) 100 MG capsule    Sig: Take 1 capsule (100 mg total) by mouth 2 (two) times daily for 10 days.    Dispense:  20 capsule    Refill:  0    Order Specific Question:   Supervising Provider    Answer:   Penni Homans A [[id number]]   predniSONE (DELTASONE) 10 MG tablet    Sig: 4 tabs by mouth once daily for 2 days, then 3 tabs daily x 2 days, then 2 tabs daily x 2 days, then 1 tab daily x 2 days    Dispense:  20 tablet    Refill:  0    Order Specific Question:   Supervising Provider    Answer:   Penni Homans A [id number]

## 2022-11-22 NOTE — Assessment & Plan Note (Signed)
Unchanged despite 10 day course of augmentin.  Will retreat with doxycycline '100mg'$  bid x 10 days and a prednisone taper. Pt is advised to call if symptoms worsen or if symptoms do not improve.

## 2022-11-28 ENCOUNTER — Telehealth: Payer: Self-pay | Admitting: Family

## 2022-11-28 DIAGNOSIS — J019 Acute sinusitis, unspecified: Secondary | ICD-10-CM

## 2022-11-28 NOTE — Telephone Encounter (Signed)
Pt requested change of location for sinus CT.

## 2022-12-15 ENCOUNTER — Ambulatory Visit
Admission: RE | Admit: 2022-12-15 | Discharge: 2022-12-15 | Disposition: A | Discharge status: Home / Self Care | Payer: Managed Care, Other (non HMO) | Source: Ambulatory Visit | Attending: Family | Admitting: Family

## 2022-12-15 DIAGNOSIS — J019 Acute sinusitis, unspecified: Secondary | ICD-10-CM

## 2022-12-27 ENCOUNTER — Encounter: Payer: Self-pay | Admitting: Family

## 2022-12-27 ENCOUNTER — Other Ambulatory Visit: Payer: Self-pay | Admitting: Family

## 2022-12-27 NOTE — Telephone Encounter (Signed)
Medication was sent already

## 2023-01-01 ENCOUNTER — Encounter: Payer: Self-pay | Admitting: *Deleted

## 2023-01-15 ENCOUNTER — Encounter: Payer: Self-pay | Admitting: Family

## 2023-01-15 ENCOUNTER — Ambulatory Visit (INDEPENDENT_AMBULATORY_CARE_PROVIDER_SITE_OTHER): Payer: Managed Care, Other (non HMO) | Admitting: Family

## 2023-01-15 ENCOUNTER — Encounter (HOSPITAL_BASED_OUTPATIENT_CLINIC_OR_DEPARTMENT_OTHER): Payer: Self-pay

## 2023-01-15 ENCOUNTER — Telehealth: Payer: Self-pay | Admitting: Family

## 2023-01-15 ENCOUNTER — Ambulatory Visit (HOSPITAL_BASED_OUTPATIENT_CLINIC_OR_DEPARTMENT_OTHER)
Admission: RE | Admit: 2023-01-15 | Discharge: 2023-01-15 | Disposition: A | Discharge status: Home / Self Care | Payer: Managed Care, Other (non HMO) | Source: Ambulatory Visit | Attending: Family | Admitting: Family

## 2023-01-15 VITALS — BP 123/70 | HR 77 | Temp 97.9°F | Resp 16 | Ht 61.0 in | Wt 218.0 lb

## 2023-01-15 DIAGNOSIS — F419 Anxiety disorder, unspecified: Secondary | ICD-10-CM

## 2023-01-15 DIAGNOSIS — Z0001 Encounter for general adult medical examination with abnormal findings: Secondary | ICD-10-CM | POA: Diagnosis not present

## 2023-01-15 DIAGNOSIS — Z0000 Encounter for general adult medical examination without abnormal findings: Secondary | ICD-10-CM

## 2023-01-15 DIAGNOSIS — F32A Depression, unspecified: Secondary | ICD-10-CM | POA: Diagnosis not present

## 2023-01-15 DIAGNOSIS — Z8619 Personal history of other infectious and parasitic diseases: Secondary | ICD-10-CM | POA: Insufficient documentation

## 2023-01-15 DIAGNOSIS — B001 Herpesviral vesicular dermatitis: Secondary | ICD-10-CM | POA: Insufficient documentation

## 2023-01-15 DIAGNOSIS — E781 Pure hyperglyceridemia: Secondary | ICD-10-CM | POA: Diagnosis not present

## 2023-01-15 DIAGNOSIS — Z1159 Encounter for screening for other viral diseases: Secondary | ICD-10-CM

## 2023-01-15 DIAGNOSIS — R739 Hyperglycemia, unspecified: Secondary | ICD-10-CM

## 2023-01-15 DIAGNOSIS — Z1231 Encounter for screening mammogram for malignant neoplasm of breast: Secondary | ICD-10-CM | POA: Diagnosis present

## 2023-01-15 DIAGNOSIS — Z114 Encounter for screening for human immunodeficiency virus [HIV]: Secondary | ICD-10-CM

## 2023-01-15 LAB — CBC WITH DIFFERENTIAL/PLATELET
Basophils Absolute: 0.1 10*3/uL (ref 0.0–0.1)
Basophils Relative: 0.9 % (ref 0.0–3.0)
Eosinophils Absolute: 0.3 10*3/uL (ref 0.0–0.7)
Eosinophils Relative: 2.8 % (ref 0.0–5.0)
HCT: 38.9 % (ref 36.0–46.0)
Hemoglobin: 13.2 g/dL (ref 12.0–15.0)
Lymphocytes Relative: 37.2 % (ref 12.0–46.0)
Lymphs Abs: 3.6 10*3/uL (ref 0.7–4.0)
MCHC: 34 g/dL (ref 30.0–36.0)
MCV: 89.4 fl (ref 78.0–100.0)
Monocytes Absolute: 0.7 10*3/uL (ref 0.1–1.0)
Monocytes Relative: 7.1 % (ref 3.0–12.0)
Neutro Abs: 5 10*3/uL (ref 1.4–7.7)
Neutrophils Relative %: 52 % (ref 43.0–77.0)
Platelets: 284 10*3/uL (ref 150.0–400.0)
RBC: 4.36 Mil/uL (ref 3.87–5.11)
RDW: 12.9 % (ref 11.5–15.5)
WBC: 9.6 10*3/uL (ref 4.0–10.5)

## 2023-01-15 LAB — COMPREHENSIVE METABOLIC PANEL
ALT: 10 U/L (ref 0–35)
AST: 11 U/L (ref 0–37)
Albumin: 3.9 g/dL (ref 3.5–5.2)
Alkaline Phosphatase: 47 U/L (ref 39–117)
BUN: 9 mg/dL (ref 6–23)
CO2: 26 mEq/L (ref 19–32)
Calcium: 8.9 mg/dL (ref 8.4–10.5)
Chloride: 102 mEq/L (ref 96–112)
Creatinine, Ser: 0.73 mg/dL (ref 0.40–1.20)
GFR: 100.97 mL/min (ref >60.00)
Glucose, Bld: 98 mg/dL (ref 70–99)
Potassium: 4.2 mEq/L (ref 3.5–5.1)
Sodium: 137 mEq/L (ref 135–145)
Total Bilirubin: 0.3 mg/dL (ref 0.2–1.2)
Total Protein: 7 g/dL (ref 6.0–8.3)

## 2023-01-15 LAB — LIPID PANEL
Cholesterol: 159 mg/dL (ref 0–200)
HDL: 40 mg/dL (ref >39.00)
LDL Cholesterol: 87 mg/dL (ref 0–99)
NonHDL: 118.69
Total CHOL/HDL Ratio: 4
Triglycerides: 157 mg/dL — ABNORMAL HIGH (ref 0.0–149.0)
VLDL: 31.4 mg/dL (ref 0.0–40.0)

## 2023-01-15 LAB — TSH: TSH: 1.08 u[IU]/mL (ref 0.35–5.50)

## 2023-01-15 MED ORDER — VALACYCLOVIR HCL 1 G PO TABS: ORAL_TABLET | ORAL | 0 refills | Status: DC

## 2023-01-15 NOTE — Assessment & Plan Note (Signed)
New. Will rx with valtrex.   

## 2023-01-15 NOTE — Assessment & Plan Note (Signed)
Stable on lexapro.  Continues klonopin 1/2 tab at bedtime and prn.

## 2023-01-15 NOTE — Telephone Encounter (Signed)
Please call Dr. Maryellen Pile office to request copy of pap.

## 2023-01-15 NOTE — Telephone Encounter (Signed)
Electronic request sent 

## 2023-01-15 NOTE — Assessment & Plan Note (Signed)
Wt Readings from Last 3 Encounters:  01/15/23 218 lb (98.9 kg)  11/22/22 216 lb (98 kg)  11/08/22 216 lb (98 kg)   Discussed increasing walking.  Continue efforts at improving diet/weight loss.  Vision/dental and pap up to date. Will request copy of pap from GYN. She will complete mammogram today.  Recommended covid booster and flu shot this fall.

## 2023-01-15 NOTE — Progress Notes (Signed)
Subjective:   By signing my name below, I, Shehryar Baig, attest that this documentation has been prepared under the direction and in the presence of Sandford Craze, NP. 01/15/2023   Patient ID: Anita Cox, female    DOB: [birth year], 42 y.o.   MRN: [id number]  Chief Complaint  Patient presents with   Annual Exam    HPI Patient is in today for a comprehensive physical exam.   Cold sore: She reports developing a cold sore on her lips 4 days ago. She is interested in taking valtrex to help manage her symptoms.   Lexapro: She continues taking 10 mg Lexapro daily PO and reports her anxiety and mood has improved.   Clonazepam: She continues taking 0.5 mg Clonazepam at night to help her sleep.   Dairy products: She reports developing upset stomach after eating dairy products and tries to avoid them to prevent symptoms.   Acute: She denies fever, unexpected weight change, adenopathy, new moles, sinus pain, sore throat, visual disturbance, chest pain, palpitations, leg swelling, cough, shortness of breath, wheezing, nausea, vomiting, diarrhea, constipation, blood in stool, dysuria, frequency, hematuria, new muscle pain, new joint pain, headaches, depression or anxiety at this time.   Social history: She has no new surgeries to report. She has no changes to her family medical history. She rarely drinks alcohol. She does not use drugs. She does not use tobacco or vaping products and hasn't for the past year.   Immunizations: She is UTD on tetanus vaccine. She did not receive the latest flu vaccine. She is interested in HIV and hepatitis C screening during her next blood work.   Diet: She is managing a healthy diet and is reports cutting out fast foods.   Exercise: She is not exercising regularly. She is planning on walking more routinely.   Pap Smear: She reports completing a pap smear this year with her GYN specialist.   Mammogram: Last completed 12/23/2020. Results are normal.  Repeat in 1 year.   Dental: up to date  Vision: up to date   Past Medical History:  Diagnosis Date   Acute bronchitis 12/27/2011   Anemia    Anxiety    Frequent UTI    Hay fever    with allergies   History of chicken pox    History of kidney stones    07/2016   Pre-diabetes     Past Surgical History:  Procedure Laterality Date   NO PAST SURGERIES     denies surgical history    Family History  Problem Relation Age of Onset   Arthritis Mother    Drug abuse Mother    Diabetes Other        grandmother   Diabetes Paternal Grandfather    Diabetes Paternal Grandmother    Lupus Half-Sister    OCD Half-Sister    Bipolar disorder Half-Sister    ADD / ADHD Half-Sister    Anxiety disorder Half-Brother    Irritable bowel syndrome Half-Brother    Diabetes Mellitus II Maternal Grandmother    Urinary tract infection Maternal Grandmother    Alcohol abuse Maternal Grandfather     Social History   Socioeconomic History   Marital status: Married    Spouse name: Not on file   Number of children: Not on file   Years of education: Not on file   Highest education level: Not on file  Occupational History   Not on file  Tobacco Use   Smoking status: Former  Packs/day: 1    Types: Cigarettes    Quit date: 01/2022    Years since quitting: 0.9   Smokeless tobacco: Never  Vaping Use   Vaping Use: Never used  Substance and Sexual Activity   Alcohol use: Yes    Comment: rare wine   Drug use: No   Sexual activity: Not Currently  Other Topics Concern   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   Raised by her maternal GM- mother has drug abuse issues   One adopted son (biological nephew)   Married   4 cats   Enjoys reading, Clinical cytogeneticist, Horticulturist, commercial   Social Determinants of Health   Financial Resource Strain: Not on file  Food Insecurity: Not on file  Transportation Needs: Not on file  Physical Activity:  Not on file  Stress: Not on file  Social Connections: Not on file  Intimate Partner Violence: Not on file    Outpatient Medications Prior to Visit  Medication Sig Dispense Refill   clonazePAM (KLONOPIN) 0.5 MG tablet Take 0.5 mg by mouth 2 (two) times daily as needed.     escitalopram (LEXAPRO) 10 MG tablet TAKE 1 TABLET BY MOUTH EVERY DAY 90 tablet 1   TRI-PREVIFEM 0.18/0.215/0.25 MG-35 MCG tablet Take 1 tablet by mouth daily.  11   betamethasone valerate ointment (VALISONE) 0.1 % Apply 1 Application topically 2 (two) times daily. 30 g 0   meloxicam (MOBIC) 7.5 MG tablet Take 1 tablet (7.5 mg total) by mouth daily. 14 tablet 0   predniSONE (DELTASONE) 10 MG tablet 4 tabs by mouth once daily for 2 days, then 3 tabs daily x 2 days, then 2 tabs daily x 2 days, then 1 tab daily x 2 days 20 tablet 0   No facility-administered medications prior to visit.    No Known Allergies  Review of Systems  Constitutional:  Negative for fever.       (-)unexpected weight change (-)Adenopathy  HENT:  Negative for congestion, sinus pain and sore throat.   Eyes:        (-)Visual disturbance  Respiratory:  Negative for cough, shortness of breath and wheezing.   Cardiovascular:  Negative for chest pain, palpitations and leg swelling.  Gastrointestinal:  Negative for blood in stool, constipation, diarrhea, nausea and vomiting.  Genitourinary:  Negative for dysuria, frequency and hematuria.  Musculoskeletal:        (-)new muscle pain (-)new joint pain  Skin:        (-)new moles (+)cold sore on lips  Neurological:  Negative for dizziness and headaches.  Psychiatric/Behavioral:  Negative for depression. The patient is not nervous/anxious.        Objective:    Physical Exam Constitutional:      General: She is not in acute distress.    Appearance: Normal appearance. She is not ill-appearing.  HENT:     Head: Normocephalic and atraumatic.     Right Ear: Tympanic membrane, ear canal and external  ear normal.     Left Ear: Tympanic membrane, ear canal and external ear normal.  Eyes:     Extraocular Movements: Extraocular movements intact.     Right eye: No nystagmus.     Left eye: No nystagmus.     Pupils: Pupils are equal, round, and reactive to light.  Neck:     Thyroid: No thyroid tenderness.  Cardiovascular:     Rate and Rhythm: Normal rate  and regular rhythm.     Heart sounds: Normal heart sounds. No murmur heard.    No gallop.  Pulmonary:     Effort: Pulmonary effort is normal. No respiratory distress.     Breath sounds: Normal breath sounds. No wheezing or rales.  Abdominal:     General: There is no distension.     Palpations: Abdomen is soft.     Tenderness: There is no abdominal tenderness. There is no guarding.  Musculoskeletal:     Comments: 5/5 strength in both upper and lower extremities  Lymphadenopathy:     Cervical: No cervical adenopathy.  Skin:    General: Skin is warm and dry.     Comments: Cold sore noted left upper lip  Neurological:     Mental Status: She is alert and oriented to person, place, and time.     Deep Tendon Reflexes:     Reflex Scores:      Patellar reflexes are 2+ on the right side and 2+ on the left side. Psychiatric:        Judgment: Judgment normal.     BP 123/70 (BP Location: Right Arm, Patient Position: Sitting, Cuff Size: Large)   Pulse 77   Temp 97.9 F (36.6 C) (Oral)   Resp 16   Ht 5\' 1"  (1.549 m)   Wt 218 lb (98.9 kg)   SpO2 99%   BMI 41.19 kg/m  Wt Readings from Last 3 Encounters:  01/15/23 218 lb (98.9 kg)  11/22/22 216 lb (98 kg)  11/08/22 216 lb (98 kg)       Assessment & Plan:  Anxiety and depression Assessment & Plan: Stable on lexapro.  Continues klonopin 1/2 tab at bedtime and prn.    Encounter for hepatitis C screening test for low risk patient -     Hepatitis C antibody  Encounter for screening for HIV -     HIV Antibody (routine testing w rflx)  Hyperglycemia -     Comprehensive  metabolic panel  Hypertriglyceridemia -     Lipid panel  Preventative health care Assessment & Plan: Wt Readings from Last 3 Encounters:  01/15/23 218 lb (98.9 kg)  11/22/22 216 lb (98 kg)  11/08/22 216 lb (98 kg)   Discussed increasing walking.  Continue efforts at improving diet/weight loss.  Vision/dental and pap up to date. Will request copy of pap from GYN. She will complete mammogram today.  Recommended covid booster and flu shot this fall.   Orders: -     CBC with Differential/Platelet -     TSH  Cold sore Assessment & Plan: New. Will rx with valtrex.    Other orders -     valACYclovir HCl; Take one tablet this AM and on tab this PM  Dispense: 6 tablet; Refill: 0    I, Lemont Fillers, NP, personally preformed the services described in this documentation.  All medical record entries made by the scribe were at my direction and in my presence.  I have reviewed the chart and discharge instructions (if applicable) and agree that the record reflects my personal performance and is accurate and complete. 01/15/2023   I,Shehryar Baig,acting as a Neurosurgeon for Lemont Fillers, NP.,have documented all relevant documentation on the behalf of Lemont Fillers, NP,as directed by  Lemont Fillers, NP while in the presence of Lemont Fillers, NP.   Lemont Fillers, NP

## 2023-01-16 LAB — HEPATITIS C ANTIBODY: Hepatitis C Ab: NONREACTIVE

## 2023-01-16 LAB — HIV ANTIBODY (ROUTINE TESTING W REFLEX): HIV 1&2 Ab, 4th Generation: NONREACTIVE

## 2023-04-20 ENCOUNTER — Encounter: Payer: Self-pay | Admitting: Family

## 2023-04-20 ENCOUNTER — Ambulatory Visit: Payer: Managed Care, Other (non HMO) | Admitting: Family

## 2023-04-20 VITALS — BP 130/88 | HR 84 | Temp 98.1°F | Resp 18 | Ht 61.0 in | Wt 225.2 lb

## 2023-04-20 DIAGNOSIS — R011 Cardiac murmur, unspecified: Secondary | ICD-10-CM

## 2023-04-20 DIAGNOSIS — L709 Acne, unspecified: Secondary | ICD-10-CM | POA: Diagnosis not present

## 2023-04-20 DIAGNOSIS — E282 Polycystic ovarian syndrome: Secondary | ICD-10-CM

## 2023-04-20 DIAGNOSIS — M545 Low back pain, unspecified: Secondary | ICD-10-CM

## 2023-04-20 MED ORDER — MELOXICAM 7.5 MG PO TABS
7.5000 mg | ORAL_TABLET | Freq: Every day | ORAL | 0 refills | Status: AC
Start: 2023-04-20 — End: ?

## 2023-04-20 MED ORDER — CLINDAMYCIN PHOS-BENZOYL PEROX 1-5 % EX GEL: Freq: Two times a day (BID) | CUTANEOUS | 2 refills | Status: AC

## 2023-04-20 NOTE — Progress Notes (Unsigned)
Subjective:     Patient ID: Anita Cox, female    DOB: 1981/06/08, 42 y.o.   MRN: [id number]  Chief Complaint  Patient presents with   PCOS    Pt states breakout has been going on since CPE.    Back Pain    Per patient several months, pt states having tingling sensation up the back and feels a lot with bending. Mostly on the left side.    HPI  Discussed the use of AI scribe software for clinical note transcription with the patient, who gave verbal consent to proceed.  History of Present Illness   The patient, with a history of PCOS, presents with worsening acne. She describes the acne as itchy, sometimes burning, and progressively spreading from the lower face to the nose. She has a history of acne, but the current outbreak is more severe than previous episodes. She has tried various over-the-counter treatments, including hibiclens and a salve, with minimal improvement. She has also attempted dietary changes, such as eliminating oat milk, without success. She is currently on birth control, which she has been taking for a long time, and recently stopped metformin due to gastrointestinal side effects.  In addition to the acne, the patient reports lower back pain, particularly on the left side. The pain is severe enough to interrupt her daily activities and has been associated with a tingling sensation traveling up her back. She also reports recent weight gain, despite no significant changes in diet or activity level. The weight gain started around the time she stopped metformin and started Lexapro.  The patient also mentions a murmur that she can sometimes feel, but it has not been previously discussed or evaluated.          Health Maintenance Due  Topic Date Due   COVID-19 Vaccine (4 - 2023-24 season) 05/19/2022   INFLUENZA VACCINE  04/19/2023    Past Medical History:  Diagnosis Date   Acute bronchitis 12/27/2011   Anemia    Anxiety    Frequent UTI    Hay fever    with  allergies   History of chicken pox    History of kidney stones    07/2016   Pre-diabetes     Past Surgical History:  Procedure Laterality Date   NO PAST SURGERIES     denies surgical history    Family History  Problem Relation Age of Onset   Arthritis Mother    Drug abuse Mother    Diabetes Other        grandmother   Diabetes Paternal Grandfather    Diabetes Paternal Grandmother    Lupus Half-Sister    OCD Half-Sister    Bipolar disorder Half-Sister    ADD / ADHD Half-Sister    Anxiety disorder Half-Brother    Irritable bowel syndrome Half-Brother    Diabetes Mellitus II Maternal Grandmother    Urinary tract infection Maternal Grandmother    Alcohol abuse Maternal Grandfather     Social History   Socioeconomic History   Marital status: Married    Spouse name: Not on file   Number of children: Not on file   Years of education: Not on file   Highest education level: Associate degree: occupational, Scientist, product/process development, or vocational program  Occupational History   Not on file  Tobacco Use   Smoking status: Former    Current packs/day: 0.00    Types: Cigarettes    Quit date: 01/2022    Years since quitting: 1.2  Smokeless tobacco: Never  Vaping Use   Vaping status: Never Used  Substance and Sexual Activity   Alcohol use: Yes    Comment: rare wine   Drug use: No   Sexual activity: Not Currently  Other Topics Concern   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   Raised by her maternal GM- mother has drug abuse issues   One adopted son (biological nephew)   Married   4 cats   Enjoys reading, Clinical cytogeneticist, television   Cleaning/organizing   Social Determinants of Health   Financial Resource Strain: Low Risk  (04/19/2023)   Overall Financial Resource Strain (CARDIA)    Difficulty of Paying Living Expenses: Not very hard  Food Insecurity: No Food Insecurity (04/19/2023)   Hunger Vital Sign    Worried About Running Out of Food  in the Last Year: Never true    Ran Out of Food in the Last Year: Never true  Transportation Needs: No Transportation Needs (04/19/2023)   PRAPARE - Administrator, Civil Service (Medical): No    Lack of Transportation (Non-Medical): No  Physical Activity: Unknown (04/19/2023)   Exercise Vital Sign    Days of Exercise per Week: 0 days    Minutes of Exercise per Session: Not on file  Stress: Patient Declined (04/19/2023)   [employer] of Occupational Health - Occupational Stress Questionnaire    Feeling of Stress : Patient declined  Social Connections: Unknown (04/19/2023)   Social Connection and Isolation Panel [NHANES]    Frequency of Communication with Friends and Family: Patient declined    Frequency of Social Gatherings with Friends and Family: Patient declined    Attends Religious Services: Patient declined    Database administrator or Organizations: Patient declined    Attends Engineer, structural: Not on file    Marital Status: Married  Catering manager Violence: Not on file    Outpatient Medications Prior to Visit  Medication Sig Dispense Refill   clonazePAM (KLONOPIN) 0.5 MG tablet Take 0.5 mg by mouth 2 (two) times daily as needed.     escitalopram (LEXAPRO) 10 MG tablet TAKE 1 TABLET BY MOUTH EVERY DAY 90 tablet 1   TRI-PREVIFEM 0.18/0.215/0.25 MG-35 MCG tablet Take 1 tablet by mouth daily.  11   valACYclovir (VALTREX) 1000 MG tablet Take one tablet this AM and on tab this PM 6 tablet 0   No facility-administered medications prior to visit.    No Known Allergies  ROS     Objective:    Physical Exam Constitutional:      General: She is not in acute distress.    Appearance: Normal appearance. She is well-developed.  HENT:     Head: Normocephalic and atraumatic.     Right Ear: External ear normal.     Left Ear: External ear normal.  Eyes:     General: No scleral icterus. Neck:     Thyroid: No thyromegaly.  Cardiovascular:     Rate and  Rhythm: Normal rate and regular rhythm.     Heart sounds: Murmur heard.     Systolic murmur is present with a grade of 2/6.  Pulmonary:     Effort: Pulmonary effort is normal. No respiratory distress.     Breath sounds: Normal breath sounds. No wheezing.  Musculoskeletal:     Cervical back: Neck supple.     Comments: Bilateral LE strength is 5/5  Skin:  General: Skin is warm and dry.     Comments: Acne noted on cheeks forehead and nose  Neurological:     Mental Status: She is alert and oriented to person, place, and time.     Deep Tendon Reflexes:     Reflex Scores:      Patellar reflexes are 2+ on the right side and 2+ on the left side. Psychiatric:        Mood and Affect: Mood normal.        Behavior: Behavior normal.        Thought Content: Thought content normal.        Judgment: Judgment normal.      BP 130/88 (BP Location: Left Arm, Patient Position: Sitting, Cuff Size: Large)   Pulse 84   Temp 98.1 F (36.7 C) (Oral)   Resp 18   Ht 5\' 1"  (1.549 m)   Wt 225 lb 3.2 oz (102.2 kg)   SpO2 96%   BMI 42.55 kg/m  Wt Readings from Last 3 Encounters:  04/20/23 225 lb 3.2 oz (102.2 kg)  01/15/23 218 lb (98.9 kg)  11/22/22 216 lb (98 kg)       Assessment & Plan:   Problem List Items Addressed This Visit       Unprioritized   PCOS (polycystic ovarian syndrome)     Currently managed with birth control. Recent discontinuation of Metformin due to gastrointestinal side effects. -Continue current birth control regimen.      Murmur     Newly identified heart murmur. -Order baseline ultrasound to evaluate for structural abnormalities.      Relevant Orders   ECHOCARDIOGRAM COMPLETE   Morbid obesity (HCC)     Recent weight gain, potentially related to cessation of Metformin and/or smoking cessation. -Encourage calorie counting, increased physical activity, and healthy eating habits.      Acute low back pain     New onset lower back pain with tingling  sensation radiating up the back. Likely due to pinched nerve. -Prescribe two weeks of daily anti-inflammatory medication (meloxicam).       Relevant Medications   meloxicam (MOBIC) 7.5 MG tablet   Acne - Primary     worsening acne despite current birth control use. No improvement with cessation of oat milk. Previous use of Hibiclens resulted in skin scaling. -Start topical benzaclin gel twice daily, reduce to once daily if too drying. -Refer to dermatology for further evaluation and management. -Advise use of Cetaphil sensitive skin face wash and moisturizer twice daily.      Relevant Medications   clindamycin-benzoyl peroxide (BENZACLIN) gel   Other Relevant Orders   Ambulatory referral to Dermatology    I am having Penelope Fittro. Stopper start on clindamycin-benzoyl peroxide and meloxicam. I am also having her maintain her clonazePAM, Tri-Previfem, escitalopram, and valACYclovir.  Meds ordered this encounter  Medications   clindamycin-benzoyl peroxide (BENZACLIN) gel    Sig: Apply topically 2 (two) times daily.    Dispense:  25 g    Refill:  2    Order Specific Question:   Supervising Provider    Answer:   Danise Edge A [[id number]]   meloxicam (MOBIC) 7.5 MG tablet    Sig: Take 1 tablet (7.5 mg total) by mouth daily.    Dispense:  14 tablet    Refill:  0    Order Specific Question:   Supervising Provider    Answer:   Danise Edge A [[id number]]

## 2023-04-22 DIAGNOSIS — L709 Acne, unspecified: Secondary | ICD-10-CM | POA: Insufficient documentation

## 2023-04-22 DIAGNOSIS — M545 Low back pain, unspecified: Secondary | ICD-10-CM | POA: Insufficient documentation

## 2023-04-22 DIAGNOSIS — R011 Cardiac murmur, unspecified: Secondary | ICD-10-CM | POA: Insufficient documentation

## 2023-04-22 NOTE — Assessment & Plan Note (Signed)
  Recent weight gain, potentially related to cessation of Metformin and/or smoking cessation. -Encourage calorie counting, increased physical activity, and healthy eating habits.

## 2023-04-22 NOTE — Patient Instructions (Signed)
VISIT SUMMARY:  During your visit, we discussed your concerns about worsening acne, lower back pain, recent weight gain, and a heart murmur you've noticed. We also reviewed your ongoing management of Polycystic Ovary Syndrome (PCOS).  YOUR PLAN:  -ACNE: Your acne has become more severe and is not improving with current treatments. Acne is a skin condition that occurs when your hair follicles become plugged with oil and dead skin cells. We will start you on a topical benzoyl peroxide treatment and refer you to a dermatologist for further evaluation. We also recommend using Cetaphil sensitive skin face wash and moisturizer twice daily.  -POLYCYSTIC OVARY SYNDROME (PCOS): PCOS is a hormonal disorder common among women of reproductive age. You should continue your current birth control regimen to manage this condition.  -WEIGHT GAIN: You've noticed weight gain, which could be related to stopping Metformin or changes in your lifestyle. We recommend calorie counting, increased physical activity, and healthy eating habits to manage your weight.  -LOWER BACK PAIN: Your lower back pain, which is severe enough to interrupt your daily activities, is likely due to a pinched nerve. We will prescribe a two-week course of daily anti-inflammatory medication to help manage this.  -HEART MURMUR: A heart murmur is a sound during your heartbeat cycle made by turbulent blood in or near your heart. You've noticed this recently, and we will order a baseline ultrasound to check for any structural abnormalities in your heart.  INSTRUCTIONS:  Please follow up in 2-3 weeks to assess the improvement in your acne. If there's no improvement, we may consider a daily antibiotic for inflammation. If your back pain improves, you can stop the anti-inflammatory medication. We will also schedule an ultrasound for your heart murmur evaluation.

## 2023-04-22 NOTE — Assessment & Plan Note (Signed)
  worsening acne despite current birth control use. No improvement with cessation of oat milk. Previous use of Hibiclens resulted in skin scaling. -Start topical benzaclin gel twice daily, reduce to once daily if too drying. -Refer to dermatology for further evaluation and management. -Advise use of Cetaphil sensitive skin face wash and moisturizer twice daily.

## 2023-04-22 NOTE — Assessment & Plan Note (Signed)
  New onset lower back pain with tingling sensation radiating up the back. Likely due to pinched nerve. -Prescribe two weeks of daily anti-inflammatory medication (meloxicam).

## 2023-04-22 NOTE — Assessment & Plan Note (Signed)
  Currently managed with birth control. Recent discontinuation of Metformin due to gastrointestinal side effects. -Continue current birth control regimen.

## 2023-04-22 NOTE — Assessment & Plan Note (Signed)
  Newly identified heart murmur. -Order baseline ultrasound to evaluate for structural abnormalities.

## 2023-05-17 ENCOUNTER — Ambulatory Visit (HOSPITAL_BASED_OUTPATIENT_CLINIC_OR_DEPARTMENT_OTHER)
Admission: RE | Admit: 2023-05-17 | Discharge: 2023-05-17 | Disposition: A | Discharge status: Home / Self Care | Payer: Managed Care, Other (non HMO) | Source: Ambulatory Visit | Attending: Family | Admitting: Family

## 2023-05-17 DIAGNOSIS — R011 Cardiac murmur, unspecified: Secondary | ICD-10-CM | POA: Diagnosis present

## 2023-05-17 LAB — ECHOCARDIOGRAM COMPLETE
AR max vel: 1.88 cm2
AV Area VTI: 2.19 cm2
AV Area mean vel: 1.95 cm2
AV Mean grad: 7 mmHg
AV Peak grad: 14.4 mmHg
Ao pk vel: 1.9 m/s
Area-P 1/2: 3.99 cm2
Calc EF: 65.1 %
S' Lateral: 2.2 cm
Single Plane A2C EF: 59 %
Single Plane A4C EF: 68.8 %

## 2023-06-20 ENCOUNTER — Telehealth: Payer: Managed Care, Other (non HMO) | Admitting: Urgent Care

## 2023-06-20 DIAGNOSIS — M546 Pain in thoracic spine: Secondary | ICD-10-CM | POA: Diagnosis not present

## 2023-06-20 MED ORDER — ETODOLAC 300 MG PO CAPS: 300.0000 mg | ORAL_CAPSULE | Freq: Two times a day (BID) | ORAL | 0 refills | Status: DC

## 2023-06-20 MED ORDER — BACLOFEN 10 MG PO TABS: 10.0000 mg | ORAL_TABLET | Freq: Three times a day (TID) | ORAL | 0 refills | Status: AC

## 2023-06-20 NOTE — Progress Notes (Signed)
We are sorry that you are not feeling well.  Here is how we plan to help!  Based on what you have shared with me it looks like you mostly have acute back pain.  Acute back pain is defined as musculoskeletal pain that can resolve in 1-3 weeks with conservative treatment.  I have prescribed Etodolac 300 mg take one by mouth twice a day non-steroid anti-inflammatory (NSAID) as well as Baclofen 10 mg every eight hours as needed which is a muscle relaxer  Some patients experience stomach irritation or in increased heartburn with anti-inflammatory drugs.  Please keep in mind that muscle relaxer's can cause fatigue and should not be taken while at work or driving.  Back pain is very common.  The pain often gets better over time.  The cause of back pain is usually not dangerous.  Most people can learn to manage their back pain on their own.  IF YOU ARE STILL TAKING MELOXICAM DAILY, YOU MUST STOP THIS WHILE TAKING ETODOLAC.   Home Care Stay active.  Start with short walks on flat ground if you can.  Try to walk farther each day. Do not sit, drive or stand in one place for more than 30 minutes.  Do not stay in bed. Do not avoid exercise or work.  Activity can help your back heal faster. Be careful when you bend or lift an object.  Bend at your knees, keep the object close to you, and do not twist. Sleep on a firm mattress.  Lie on your side, and bend your knees.  If you lie on your back, put a pillow under your knees. Only take medicines as told by your doctor. Put ice on the injured area. Put ice in a plastic bag Place a towel between your skin and the bag Leave the ice on for 15-20 minutes, 3-4 times a day for the first 2-3 days. 210 After that, you can switch between ice and heat packs. Ask your doctor about back exercises or massage. Avoid feeling anxious or stressed.  Find good ways to deal with stress, such as exercise.  Get Help Right Way If: Your pain does not go away with rest or  medicine. Your pain does not go away in 1 week. You have new problems. You do not feel well. The pain spreads into your legs. You cannot control when you poop (bowel movement) or pee (urinate) You feel sick to your stomach (nauseous) or throw up (vomit) You have belly (abdominal) pain. You feel like you may pass out (faint). If you develop a fever.  Make Sure you: Understand these instructions. Will watch your condition Will get help right away if you are not doing well or get worse.  Your e-visit answers were reviewed by a board certified advanced clinical practitioner to complete your personal care plan.  Depending on the condition, your plan could have included both over the counter or prescription medications.  If there is a problem please reply  once you have received a response from your provider.  Your safety is important to [place].  If you have drug allergies check your prescription carefully.    You can use MyChart to ask questions about today's visit, request a non-urgent call back, or ask for a work or school excuse for 24 hours related to this e-Visit. If it has been greater than 24 hours you will need to follow up with your provider, or enter a new e-Visit to address those concerns.  You will  get an e-mail in the next two days asking about your experience.  I hope that your e-visit has been valuable and will speed your recovery. Thank you for using e-visits.   I have spent 5 minutes in review of e-visit questionnaire, review and updating patient chart, medical decision making and response to patient.   Janney Priego L Shean Gerding, PA

## 2023-06-21 ENCOUNTER — Other Ambulatory Visit: Payer: Self-pay | Admitting: Family

## 2023-06-26 ENCOUNTER — Ambulatory Visit: Payer: Managed Care, Other (non HMO) | Admitting: Family

## 2023-06-26 VITALS — BP 128/83 | HR 74 | Temp 98.0°F | Resp 16 | Wt 225.0 lb

## 2023-06-26 DIAGNOSIS — Z87891 Personal history of nicotine dependence: Secondary | ICD-10-CM

## 2023-06-26 DIAGNOSIS — Z23 Encounter for immunization: Secondary | ICD-10-CM | POA: Diagnosis not present

## 2023-06-26 DIAGNOSIS — F419 Anxiety disorder, unspecified: Secondary | ICD-10-CM

## 2023-06-26 DIAGNOSIS — R1011 Right upper quadrant pain: Secondary | ICD-10-CM

## 2023-06-26 DIAGNOSIS — F32A Depression, unspecified: Secondary | ICD-10-CM

## 2023-06-26 LAB — COMPREHENSIVE METABOLIC PANEL
ALT: 14 U/L (ref 0–35)
AST: 11 U/L (ref 0–37)
Albumin: 4 g/dL (ref 3.5–5.2)
Alkaline Phosphatase: 61 U/L (ref 39–117)
BUN: 12 mg/dL (ref 6–23)
CO2: 26 meq/L (ref 19–32)
Calcium: 9.1 mg/dL (ref 8.4–10.5)
Chloride: 101 meq/L (ref 96–112)
Creatinine, Ser: 0.68 mg/dL (ref 0.40–1.20)
GFR: 106.6 mL/min (ref >60.00)
Glucose, Bld: 149 mg/dL — ABNORMAL HIGH (ref 70–99)
Potassium: 4.1 meq/L (ref 3.5–5.1)
Sodium: 136 meq/L (ref 135–145)
Total Bilirubin: 0.3 mg/dL (ref 0.2–1.2)
Total Protein: 7 g/dL (ref 6.0–8.3)

## 2023-06-26 LAB — CBC WITH DIFFERENTIAL/PLATELET
Basophils Absolute: 0.1 10*3/uL (ref 0.0–0.1)
Basophils Relative: 0.5 % (ref 0.0–3.0)
Eosinophils Absolute: 0.3 10*3/uL (ref 0.0–0.7)
Eosinophils Relative: 2.4 % (ref 0.0–5.0)
HCT: 41.5 % (ref 36.0–46.0)
Hemoglobin: 13.8 g/dL (ref 12.0–15.0)
Lymphocytes Relative: 34.2 % (ref 12.0–46.0)
Lymphs Abs: 3.6 10*3/uL (ref 0.7–4.0)
MCHC: 33.1 g/dL (ref 30.0–36.0)
MCV: 88.8 fL (ref 78.0–100.0)
Monocytes Absolute: 0.7 10*3/uL (ref 0.1–1.0)
Monocytes Relative: 6.2 % (ref 3.0–12.0)
Neutro Abs: 6 10*3/uL (ref 1.4–7.7)
Neutrophils Relative %: 56.7 % (ref 43.0–77.0)
Platelets: 330 10*3/uL (ref 150.0–400.0)
RBC: 4.68 Mil/uL (ref 3.87–5.11)
RDW: 13.3 % (ref 11.5–15.5)
WBC: 10.6 10*3/uL — ABNORMAL HIGH (ref 4.0–10.5)

## 2023-06-26 NOTE — Patient Instructions (Signed)
VISIT SUMMARY:  During your visit, we discussed your ongoing indigestion and abdominal pain, which has recently been accompanied by back pain. We suspect that these symptoms may be due to gallbladder disease. We also discussed your general health and your anxiety management.  YOUR PLAN:  -SUSPECTED GALLBLADDER DISEASE: Gallbladder disease refers to medical conditions affecting the gallbladder, often involving gallstone formation. We will order an abdominal ultrasound and blood work to further investigate this. We will also refer you to a general surgeon for potential gallbladder removal, regardless of the ultrasound results. If you experience severe gallbladder flare (intolerable pain, vomiting), please go to the emergency room immediately.  -GENERAL HEALTH MAINTENANCE: As part of your general health maintenance, we administered the influenza vaccine today. This is to protect you from the flu during the flu season.  -ANXIETY: You reported that your current anxiety medication, Lexapro, is working well. We will continue with this regimen.  INSTRUCTIONS:  Please schedule an appointment for an abdominal ultrasound and blood work. Also, make an appointment with a general surgeon for a consultation regarding potential gallbladder removal. Continue taking your Lexapro as prescribed for your anxiety. If you experience severe gallbladder flare (intolerable pain, vomiting), go to the emergency room immediately.

## 2023-06-26 NOTE — Progress Notes (Signed)
Subjective:     Patient ID: Anita Cox, female    DOB: [birth year], 42 y.o.   MRN: [id number]  Chief Complaint  Patient presents with   Abdominal Pain    Patient complains of abdominal pain with indigestion   Flank Pain    Patient complains of right flank pain    Abdominal Pain  Flank Pain Associated symptoms include abdominal pain.    Discussed the use of AI scribe software for clinical note transcription with the patient, who gave verbal consent to proceed.  History of Present Illness   The patient presents with a history of indigestion and abdominal pain. The indigestion has been ongoing for some time, with episodes of pain in the upper abdomen and associated bloating. Last week, they began experiencing back pain, and over the weekend, the abdominal pain became so severe that they could not lay on their stomach. The pain does not appear to be associated with meals, but they have been experiencing increased gas. They also report nausea when laying on their stomach. The patient denies any changes in bowel movements or fever.      The patient presents with a history of indigestion and abdominal pain. The indigestion has been ongoing for some time, with episodes of pain in the epigastric and right upper abdomen and associated bloating. Last week, she began experiencing right upper back pain, and over the weekend, the abdominal pain became so severe that she could not lay on her stomach. The pain does not appear to be associated with meals, but she has been experiencing increased gas. She also reports nausea when laying on her stomach. The patient denies any changes in bowel movements or fever.    Health Maintenance Due  Topic Date Due   INFLUENZA VACCINE  04/19/2023   COVID-19 Vaccine (4 - 2023-24 season) 05/20/2023    Past Medical History:  Diagnosis Date   Acute bronchitis 12/27/2011   Anemia    Anxiety    Frequent UTI    Hay fever    with allergies   History of  chicken pox    History of kidney stones    07/2016   Pre-diabetes     Past Surgical History:  Procedure Laterality Date   NO PAST SURGERIES     denies surgical history    Family History  Problem Relation Age of Onset   Arthritis Mother    Drug abuse Mother    Diabetes Other        grandmother   Diabetes Paternal Grandfather    Diabetes Paternal Grandmother    Lupus Half-Sister    OCD Half-Sister    Bipolar disorder Half-Sister    ADD / ADHD Half-Sister    Anxiety disorder Half-Brother    Irritable bowel syndrome Half-Brother    Diabetes Mellitus II Maternal Grandmother    Urinary tract infection Maternal Grandmother    Alcohol abuse Maternal Grandfather     Social History   Socioeconomic History   Marital status: Married    Spouse name: Not on file   Number of children: Not on file   Years of education: Not on file   Highest education level: Associate degree: occupational, Scientist, product/process development, or vocational program  Occupational History   Not on file  Tobacco Use   Smoking status: Former    Current packs/day: 0.00    Types: Cigarettes    Quit date: 01/2022    Years since quitting: 1.4   Smokeless tobacco: Never  Vaping Use  Vaping status: Never Used  Substance and Sexual Activity   Alcohol use: Yes    Comment: rare wine   Drug use: No   Sexual activity: Not Currently  Other Topics Concern   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   Raised by her maternal GM- mother has drug abuse issues   One adopted son (biological nephew)   Married   4 cats   Enjoys reading, Clinical cytogeneticist, television   Cleaning/organizing   Social Determinants of Health   Financial Resource Strain: Low Risk  (04/19/2023)   Overall Financial Resource Strain (CARDIA)    Difficulty of Paying Living Expenses: Not very hard  Food Insecurity: No Food Insecurity (04/19/2023)   Hunger Vital Sign    Worried About Running Out of Food in the Last Year: Never  true    Ran Out of Food in the Last Year: Never true  Transportation Needs: No Transportation Needs (04/19/2023)   PRAPARE - Administrator, Civil Service (Medical): No    Lack of Transportation (Non-Medical): No  Physical Activity: Unknown (04/19/2023)   Exercise Vital Sign    Days of Exercise per Week: 0 days    Minutes of Exercise per Session: Not on file  Stress: Patient Declined (04/19/2023)   [employer] of Occupational Health - Occupational Stress Questionnaire    Feeling of Stress : Patient declined  Social Connections: Unknown (04/19/2023)   Social Connection and Isolation Panel [NHANES]    Frequency of Communication with Friends and Family: Patient declined    Frequency of Social Gatherings with Friends and Family: Patient declined    Attends Religious Services: Patient declined    Database administrator or Organizations: Patient declined    Attends Engineer, structural: Not on file    Marital Status: Married  Catering manager Violence: Not on file    Outpatient Medications Prior to Visit  Medication Sig Dispense Refill   baclofen (LIORESAL) 10 MG tablet Take 1 tablet (10 mg total) by mouth 3 (three) times daily. 30 each 0   clindamycin-benzoyl peroxide (BENZACLIN) gel Apply topically 2 (two) times daily. 25 g 2   clonazePAM (KLONOPIN) 0.5 MG tablet Take 0.5 mg by mouth 2 (two) times daily as needed.     escitalopram (LEXAPRO) 10 MG tablet TAKE 1 TABLET BY MOUTH EVERY DAY 90 tablet 1   etodolac (LODINE) 300 MG capsule Take 1 capsule (300 mg total) by mouth 2 (two) times daily with a meal. 20 capsule 0   TRI-PREVIFEM 0.18/0.215/0.25 MG-35 MCG tablet Take 1 tablet by mouth daily.  11   valACYclovir (VALTREX) 1000 MG tablet Take one tablet this AM and on tab this PM 6 tablet 0   No facility-administered medications prior to visit.    No Known Allergies  Review of Systems  Gastrointestinal:  Positive for abdominal pain.  Genitourinary:  Positive for  flank pain.       Objective:    Physical Exam Constitutional:      General: She is not in acute distress.    Appearance: Normal appearance. She is well-developed.  HENT:     Head: Normocephalic and atraumatic.     Right Ear: External ear normal.     Left Ear: External ear normal.  Eyes:     General: No scleral icterus. Neck:     Thyroid: No thyromegaly.  Cardiovascular:     Rate and Rhythm: Normal rate and  regular rhythm.     Heart sounds: Normal heart sounds. No murmur heard. Pulmonary:     Effort: Pulmonary effort is normal. No respiratory distress.     Breath sounds: Normal breath sounds. No wheezing.  Abdominal:     General: Bowel sounds are normal.     Tenderness: There is abdominal tenderness in the right upper quadrant. There is no guarding. Positive signs include Murphy's sign.  Musculoskeletal:     Cervical back: Neck supple.  Skin:    General: Skin is warm and dry.  Neurological:     Mental Status: She is alert and oriented to person, place, and time.  Psychiatric:        Mood and Affect: Mood normal.        Behavior: Behavior normal.        Thought Content: Thought content normal.        Judgment: Judgment normal.      BP 128/83 (BP Location: Right Arm, Patient Position: Sitting, Cuff Size: Large)   Pulse 74   Temp 98 F (36.7 C) (Oral)   Resp 16   Wt 225 lb (102.1 kg)   SpO2 98%   BMI 42.51 kg/m  Wt Readings from Last 3 Encounters:  06/26/23 225 lb (102.1 kg)  04/20/23 225 lb 3.2 oz (102.2 kg)  01/15/23 218 lb (98.9 kg)       Assessment & Plan:   Problem List Items Addressed This Visit       Unprioritized   RUQ pain - Primary    New. Suspect cholecystitis.  Obtain US, refer to general surgeon, obtain labs as ordered.  Advised pt to go to the ER if symptoms worsen.       Relevant Orders   US Abdomen Limited RUQ (LIVER/GB)   CBC w/Diff   Comp Met (CMET)   Ambulatory referral to General Surgery   History of tobacco abuse    She  remains tobacco free.  I commended her on this.       Anxiety and depression    Reports mood is great on lexapro. Continue same.        Flu shot today.  I am having Farin Buhman. Bezdek maintain her clonazePAM, Tri-Previfem, valACYclovir, clindamycin-benzoyl peroxide, etodolac, baclofen, and escitalopram.  No orders of the defined types were placed in this encounter.

## 2023-06-26 NOTE — Assessment & Plan Note (Signed)
She remains tobacco free.  I commended her on this.

## 2023-06-26 NOTE — Assessment & Plan Note (Signed)
New. Suspect cholecystitis.  Obtain US, refer to general surgeon, obtain labs as ordered.  Advised pt to go to the ER if symptoms worsen.

## 2023-06-26 NOTE — Assessment & Plan Note (Signed)
Reports mood is great on lexapro. Continue same.

## 2023-06-27 ENCOUNTER — Ambulatory Visit (HOSPITAL_BASED_OUTPATIENT_CLINIC_OR_DEPARTMENT_OTHER)
Admission: RE | Admit: 2023-06-27 | Discharge: 2023-06-27 | Disposition: A | Discharge status: Home / Self Care | Payer: Managed Care, Other (non HMO) | Source: Ambulatory Visit | Attending: Family | Admitting: Family

## 2023-06-27 DIAGNOSIS — R1011 Right upper quadrant pain: Secondary | ICD-10-CM | POA: Diagnosis present

## 2023-07-09 ENCOUNTER — Ambulatory Visit: Payer: Self-pay | Admitting: Surgery

## 2023-07-09 NOTE — H&P (Signed)
Subjective    Chief Complaint: New Consultation (RUQ pain/nausea/cholelitiasis)       History of Present Illness: Anita Cox is a 42 y.o. female who is seen today as an office consultation at the request of Dr. Peggyann Juba for evaluation of New Consultation (RUQ pain/nausea/cholelitiasis) .     This is a 42 year old female who presents with 1 year history of intermittent "indigestion" with right upper quadrant pain mild nausea, and occasional diarrhea.  The symptoms have become worse recently.  She has a constant sense of discomfort in her right upper quadrant and epigastrium.  No vomiting.  She brought this to the attention of the nurse practitioner who ordered labs which showed normal liver function test.  Ultrasound showed cholelithiasis without any sign of acute cholecystitis.  She presents now to discuss cholecystectomy.  No previous surgeries.     Review of Systems: A complete review of systems was obtained from the patient.  I have reviewed this information and discussed as appropriate with the patient.  See HPI as well for other ROS.   Review of Systems  Constitutional: Negative.   HENT: Negative.    Eyes: Negative.   Respiratory: Negative.    Cardiovascular: Negative.   Gastrointestinal:  Positive for abdominal pain, diarrhea and nausea.  Genitourinary:  Positive for flank pain.  Skin: Negative.   Neurological: Negative.   Endo/Heme/Allergies: Negative.   Psychiatric/Behavioral: Negative.          Medical History: Past Medical History      Past Medical History:  Diagnosis Date   Anxiety     Diabetes mellitus without complication (CMS/HHS-HCC)          Problem List     Patient Active Problem List  Diagnosis   Anemia, unspecified   Acute low back pain   Cervical radiculopathy   Acute maxillary sinusitis   Anxiety and depression   Diarrhea   History of tobacco abuse   Metrorrhagia   Morbid obesity (CMS/HHS-HCC)   Murmur   PCOS (polycystic ovarian  syndrome)   RUQ pain   Strain of lumbar region   Calculus of gallbladder with chronic cholecystitis without obstruction        Past Surgical History  History reviewed. No pertinent surgical history.      Allergies  No Known Allergies     Medications Ordered Prior to Encounter        Current Outpatient Medications on File Prior to Visit  Medication Sig Dispense Refill   cetirizine (ZYRTEC) 10 MG tablet Take 10 mg by mouth       clonazePAM (KLONOPIN) 1 MG tablet Take 1 mg by mouth 3 (three) times daily       escitalopram oxalate (LEXAPRO) 10 MG tablet Take 1 tablet by mouth once daily       TRI-ESTARYLLA 0.18/0.215/0.25 mg-35 mcg (28) tab Take 1 tablet by mouth once daily        No current facility-administered medications on file prior to visit.        Family History       Family History  Problem Relation Age of Onset   Arthritis Mother     Drug abuse Mother     Lupus Sister     OCD Sister     Bipolar disorder Sister     ADD / ADHD Sister     Anxiety Brother     Irritable bowel syndrome Brother     Diabetes Maternal Grandmother     Alcohol abuse Maternal  Grandfather     Diabetes Paternal Grandmother     Diabetes Paternal Grandfather          Tobacco Use History  Social History        Tobacco Use  Smoking Status Former   Types: Cigarettes  Smokeless Tobacco Not on file        Social History  Social History         Socioeconomic History   Marital status: Married  Tobacco Use   Smoking status: Former      Types: Cigarettes  Vaping Use   Vaping status: Unknown  Substance and Sexual Activity   Alcohol use: Not Currently   Drug use: Never    Social Drivers of Acupuncturist Strain: Low Risk  (04/19/2023)    Received [facility]    Overall Financial Resource Strain (CARDIA)     Difficulty of Paying Living Expenses: Not very hard  Food Insecurity: No Food Insecurity (04/19/2023)    [facility]    Hunger Vital  Sign     Worried About Running Out of Food in the Last Year: Never true     Ran Out of Food in the Last Year: Never true  Transportation Needs: No Transportation Needs (04/19/2023)    [facility] - [facility]     Lack of Transportation (Medical): No     Lack of Transportation (Non-Medical): No  Physical Activity: Unknown (04/19/2023)    [facility] from [facility]    Exercise Vital Sign     Days of Exercise per Week: 0 days  Stress: Patient Declined (04/19/2023)    Received from [facility]ational [facility] - Occupational Stress Questionnaire     Feeling of Stress : Patient declined  Social Connections: Unknown (04/19/2023)    Received from [facility]    Social Connection and Isolation Panel [NHANES]     Frequency of Communication with Friends and Family: Patient declined     Frequency of Social Gatherings with Friends and Family: Patient declined     Attends Religious Services: Patient declined     Database administrator or Organizations: Patient declined     Marital Status: Married        Objective:         Vitals:    07/09/23 1005  BP: 121/82  Pulse: 77  Temp: 36.8 C (98.2 F)  SpO2: 96%  Weight: (!) 101.9 kg (224 lb 9.6 oz)  Height: 154.9 cm (5\' 1" )  PainSc:   4    Body mass index is 42.44 kg/m.   Physical Exam    Constitutional:  WDWN in NAD, conversant, no obvious deformities; lying in bed comfortably Eyes:  Pupils equal, round; sclera anicteric; moist conjunctiva; no lid lag HENT:  Oral mucosa moist; good dentition  Neck:  No masses palpated, trachea midline; no thyromegaly Lungs:  CTA bilaterally; normal respiratory effort CV:  Regular rate and rhythm; no murmurs; extremities well-perfused with no edema Abd:  +bowel sounds, soft, mildly tender to palpation in right upper quadrant and epigastrium, no palpable organomegaly; no palpable hernias Musc: Normal gait; no apparent clubbing or cyanosis in  extremities Lymphatic:  No palpable cervical or axillary lymphadenopathy Skin:  Warm, dry; no sign of jaundice Psychiatric - alert and oriented x 4; calm mood and affect     Labs, Imaging and Diagnostic Testing:  LFT's WNL 06/26/23   CLINICAL DATA:  Right upper quadrant pain   EXAM: ULTRASOUND ABDOMEN LIMITED RIGHT UPPER QUADRANT   COMPARISON:  Renal stone CT 07/11/2016   FINDINGS: Gallbladder:   Cholelithiasis. No gallbladder wall thickening or pericholecystic fluid. Negative sonographic Murphy's sign.   Common bile duct:   Diameter: 4.5 mm   Liver:   Increased echogenicity. No focal lesion. Portal vein is patent on color Doppler imaging with normal direction of blood flow towards the liver.   Other: None.   IMPRESSION: 1. Cholelithiasis without secondary signs of acute cholecystitis. 2. Increased hepatic parenchymal echogenicity suggestive of steatosis.     Electronically Signed   By: Annia Belt M.D.   On: 06/27/2023 14:31   Assessment and Plan:  Diagnoses and all orders for this visit:   Calculus of gallbladder with chronic cholecystitis without obstruction   Recommend laparoscopic cholecystectomy with intraoperative cholangiogram.The surgical procedure has been discussed with the patient.  Potential risks, benefits, alternative treatments, and expected outcomes have been explained.  All of the patient's questions at this time have been answered.  The likelihood of reaching the patient's treatment goal is good.  The patient understands the proposed surgical procedure and wishes to proceed.     Lissa Morales, MD  07/09/2023 11:34 AM

## 2023-07-17 ENCOUNTER — Ambulatory Visit: Payer: Managed Care, Other (non HMO) | Admitting: Family

## 2023-09-03 ENCOUNTER — Telehealth: Payer: Managed Care, Other (non HMO) | Admitting: Physician Assistant

## 2023-09-03 DIAGNOSIS — J019 Acute sinusitis, unspecified: Secondary | ICD-10-CM | POA: Diagnosis not present

## 2023-09-03 DIAGNOSIS — B9689 Other specified bacterial agents as the cause of diseases classified elsewhere: Secondary | ICD-10-CM | POA: Diagnosis not present

## 2023-09-03 DIAGNOSIS — J3489 Other specified disorders of nose and nasal sinuses: Secondary | ICD-10-CM | POA: Diagnosis not present

## 2023-09-03 MED ORDER — AMOXICILLIN-POT CLAVULANATE 875-125 MG PO TABS: 1.0000 | ORAL_TABLET | Freq: Two times a day (BID) | ORAL | 0 refills | Status: DC

## 2023-09-03 NOTE — Progress Notes (Signed)

## 2023-09-05 ENCOUNTER — Ambulatory Visit: Payer: Managed Care, Other (non HMO) | Admitting: Family

## 2023-09-05 ENCOUNTER — Other Ambulatory Visit (HOSPITAL_BASED_OUTPATIENT_CLINIC_OR_DEPARTMENT_OTHER): Payer: Self-pay

## 2023-09-05 VITALS — BP 137/76 | HR 65 | Temp 98.2°F | Resp 16 | Ht 61.0 in | Wt 223.0 lb

## 2023-09-05 DIAGNOSIS — L03211 Cellulitis of face: Secondary | ICD-10-CM | POA: Diagnosis not present

## 2023-09-05 DIAGNOSIS — L02219 Cutaneous abscess of trunk, unspecified: Secondary | ICD-10-CM | POA: Diagnosis not present

## 2023-09-05 MED ORDER — SULFAMETHOXAZOLE-TRIMETHOPRIM 800-160 MG PO TABS: 1.0000 | ORAL_TABLET | Freq: Two times a day (BID) | ORAL | 0 refills | Status: DC

## 2023-09-05 MED ORDER — SULFAMETHOXAZOLE-TRIMETHOPRIM 800-160 MG PO TABS
1.0000 | ORAL_TABLET | Freq: Two times a day (BID) | ORAL | 0 refills | Status: AC
  Filled 2023-09-05 (×2): qty 14, 7d supply, fill #0

## 2023-09-05 NOTE — Patient Instructions (Signed)
VISIT SUMMARY:  You came in today because of a sore in your nose that has been getting worse over the past week, along with swelling that has spread to your eye and cheek. You also have a tender bump on your abdomen. You have been taking Augmentin and ibuprofen, which have helped somewhat.  YOUR PLAN:  -FACIAL CELLULITIS: Facial cellulitis is a bacterial skin infection causing redness, swelling, and tenderness. You should continue taking Augmentin and add Bactrim twice daily for one week to cover for MRSA. If your symptoms do not significantly improve, we may need to do a CT scan to check for sinus involvement. Please come back for a re-evaluation on 09/07/2023.  -ABDOMINAL SKIN LESION: The tender bump on your abdomen should improve with the antibiotics we gave you. Please come back for a re-evaluation on 09/07/2023.  INSTRUCTIONS:  Please continue taking Augmentin and start taking Bactrim twice daily for one week. Return to the office for a re-evaluation on 09/07/2023. If your symptoms do not significantly improve, we may need to order a CT scan.

## 2023-09-05 NOTE — Assessment & Plan Note (Signed)
New.  Tender swelling of the cheek and left nare/lateral nose with serosanguinous nasal discharge. Improvement noted with Augmentin but still significant tenderness noted.   -Continue Augmentin. -Add Bactrim twice daily for 1 week to cover for MRSA. -Re-evaluate in office on 09/07/2023. If not significantly improved, order CT scan.

## 2023-09-05 NOTE — Progress Notes (Signed)
Subjective:     Patient ID: Anita Cox, female    DOB: October 05, 1980, 42 y.o.   MRN: [id number]  Chief Complaint  Patient presents with   Skin lesion    Patient reports new skin lesion on left side of abdomen   Sinus Problem    Patient reports pain and swelling on left side of nasal cavity and face    HPI  Discussed the use of AI scribe software for clinical note transcription with the patient, who gave verbal consent to proceed.  History of Present Illness   The patient presents with a week-long history of a sore in her left nare that has progressively worsened. She reports tenderness to touch, serosanguinous drainage, and swelling/tenderness that has extended to above her left eye eye and her left cheek. She initially suspected a sinus infection and has been taking Augmentin since Monday which she was prescribed via E-visit.  She notes some improvement with the antibiotic therapy and ibuprofen for pain. Concurrently, she developed a sore bump on her right lower abdomen, which is also tender. She has no known allergies.          Health Maintenance Due  Topic Date Due   COVID-19 Vaccine (4 - 2024-25 season) 05/20/2023    Past Medical History:  Diagnosis Date   Acute bronchitis 12/27/2011   Anemia    Anxiety    Frequent UTI    Hay fever    with allergies   History of chicken pox    History of kidney stones    07/2016   Pre-diabetes     Past Surgical History:  Procedure Laterality Date   NO PAST SURGERIES     denies surgical history    Family History  Problem Relation Age of Onset   Arthritis Mother    Drug abuse Mother    Diabetes Other        grandmother   Diabetes Paternal Grandfather    Diabetes Paternal Grandmother    Lupus Half-Sister    OCD Half-Sister    Bipolar disorder Half-Sister    ADD / ADHD Half-Sister    Anxiety disorder Half-Brother    Irritable bowel syndrome Half-Brother    Diabetes Mellitus II Maternal Grandmother    Urinary tract  infection Maternal Grandmother    Alcohol abuse Maternal Grandfather     Social History   Socioeconomic History   Marital status: Married    Spouse name: Not on file   Number of children: Not on file   Years of education: Not on file   Highest education level: Associate degree: occupational, Scientist, product/process development, or vocational program  Occupational History   Not on file  Tobacco Use   Smoking status: Former    Current packs/day: 0.00    Types: Cigarettes    Quit date: 01/2022    Years since quitting: 1.6   Smokeless tobacco: Never  Vaping Use   Vaping status: Never Used  Substance and Sexual Activity   Alcohol use: Yes    Comment: rare wine   Drug use: No   Sexual activity: Not Currently  Other Topics Concern   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   Raised by her maternal GM- mother has drug abuse issues   One adopted son (biological nephew)   Married   4 cats   Enjoys reading, Clinical cytogeneticist, Horticulturist, commercial   Social Drivers of Corporate investment banker  Strain: Medium Risk (09/05/2023)   Overall Financial Resource Strain (CARDIA)    Difficulty of Paying Living Expenses: Somewhat hard  Food Insecurity: No Food Insecurity (09/05/2023)   Hunger Vital Sign    Worried About Running Out of Food in the Last Year: Never true    Ran Out of Food in the Last Year: Never true  Transportation Needs: No Transportation Needs (09/05/2023)   PRAPARE - Administrator, Civil Service (Medical): No    Lack of Transportation (Non-Medical): No  Physical Activity: Unknown (09/05/2023)   Exercise Vital Sign    Days of Exercise per Week: 0 days    Minutes of Exercise per Session: Not on file  Stress: Stress Concern Present (09/05/2023)   [employer] of Occupational Health - Occupational Stress Questionnaire    Feeling of Stress : Rather much  Social Connections: Moderately Isolated (09/05/2023)   Social Connection  and Isolation Panel [NHANES]    Frequency of Communication with Friends and Family: Twice a week    Frequency of Social Gatherings with Friends and Family: Once a week    Attends Religious Services: Never    Database administrator or Organizations: No    Attends Engineer, structural: Not on file    Marital Status: Married  Catering manager Violence: Not on file    Outpatient Medications Prior to Visit  Medication Sig Dispense Refill   amoxicillin-clavulanate (AUGMENTIN) 875-125 MG tablet Take 1 tablet by mouth 2 (two) times daily. 14 tablet 0   baclofen (LIORESAL) 10 MG tablet Take 1 tablet (10 mg total) by mouth 3 (three) times daily. 30 each 0   clindamycin-benzoyl peroxide (BENZACLIN) gel Apply topically 2 (two) times daily. 25 g 2   clonazePAM (KLONOPIN) 0.5 MG tablet Take 0.5 mg by mouth 2 (two) times daily as needed.     escitalopram (LEXAPRO) 10 MG tablet TAKE 1 TABLET BY MOUTH EVERY DAY 90 tablet 1   etodolac (LODINE) 300 MG capsule Take 1 capsule (300 mg total) by mouth 2 (two) times daily with a meal. 20 capsule 0   TRI-PREVIFEM 0.18/0.215/0.25 MG-35 MCG tablet Take 1 tablet by mouth daily.  11   valACYclovir (VALTREX) 1000 MG tablet Take one tablet this AM and on tab this PM 6 tablet 0   No facility-administered medications prior to visit.    No Known Allergies  ROS    See HPI Objective:    Physical Exam Constitutional:      General: She is not in acute distress.    Appearance: Normal appearance. She is well-developed.  HENT:     Head: Normocephalic and atraumatic.     Right Ear: Tympanic membrane, ear canal and external ear normal.     Left Ear: Tympanic membrane, ear canal and external ear normal.     Nose:     Comments: Has tenderness high up in the left nare but I am unable to see far enough up to visualize presumed ulcer  Tenderness left frontal sinus area and left cheek  Slight swelling and erythema of left cheek Eyes:     General: No scleral  icterus. Neck:     Thyroid: No thyromegaly.  Cardiovascular:     Rate and Rhythm: Normal rate and regular rhythm.     Heart sounds: Normal heart sounds. No murmur heard. Pulmonary:     Effort: Pulmonary effort is normal. No respiratory distress.     Breath sounds: Normal breath sounds. No wheezing.  Musculoskeletal:  Cervical back: Neck supple.  Skin:    General: Skin is warm and dry.     Comments: Small erythematous lesion about 1 cm wide noted on right lower abdomen   Neurological:     Mental Status: She is alert and oriented to person, place, and time.  Psychiatric:        Mood and Affect: Mood normal.        Behavior: Behavior normal.        Thought Content: Thought content normal.        Judgment: Judgment normal.       BP 137/76 (BP Location: Left Arm, Patient Position: Sitting, Cuff Size: Large)   Pulse 65   Temp 98.2 F (36.8 C) (Oral)   Resp 16   Ht 5\' 1"  (1.549 m)   Wt 223 lb (101.2 kg)   SpO2 98%   BMI 42.14 kg/m  Wt Readings from Last 3 Encounters:  09/05/23 223 lb (101.2 kg)  06/26/23 225 lb (102.1 kg)  04/20/23 225 lb 3.2 oz (102.2 kg)       Assessment & Plan:   Problem List Items Addressed This Visit       Unprioritized   Cutaneous abscess of trunk   New. Appears mild at this point. Augmentin/bactrim should take care of this.       Cellulitis of external cheek, left - Primary   New.  Tender swelling of the cheek and left nare/lateral nose with serosanguinous nasal discharge. Improvement noted with Augmentin but still significant tenderness noted.   -Continue Augmentin. -Add Bactrim twice daily for 1 week to cover for MRSA. -Re-evaluate in office on 09/07/2023. If not significantly improved, order CT scan.        I am having Anita Cox. Hitsman maintain her clonazePAM, Tri-Previfem, valACYclovir, clindamycin-benzoyl peroxide, etodolac, baclofen, escitalopram, amoxicillin-clavulanate, and sulfamethoxazole-trimethoprim.  Meds ordered this  encounter  Medications   DISCONTD: sulfamethoxazole-trimethoprim (BACTRIM DS) 800-160 MG tablet    Sig: Take 1 tablet by mouth 2 (two) times daily for 7 days.    Dispense:  14 tablet    Refill:  0    Supervising Provider:   Danise Edge A [[id number]]   sulfamethoxazole-trimethoprim (BACTRIM DS) 800-160 MG tablet    Sig: Take 1 tablet by mouth 2 (two) times daily for 7 days.    Dispense:  14 tablet    Refill:  0    Supervising Provider:   Danise Edge A [[id number]]

## 2023-09-05 NOTE — Assessment & Plan Note (Signed)
New. Appears mild at this point. Augmentin/bactrim should take care of this.

## 2023-09-07 ENCOUNTER — Ambulatory Visit: Payer: Managed Care, Other (non HMO) | Admitting: Family

## 2023-09-07 VITALS — BP 126/71 | HR 79 | Temp 98.5°F | Resp 16 | Ht 61.0 in | Wt 223.0 lb

## 2023-09-07 DIAGNOSIS — L02219 Cutaneous abscess of trunk, unspecified: Secondary | ICD-10-CM | POA: Diagnosis not present

## 2023-09-07 DIAGNOSIS — L03211 Cellulitis of face: Secondary | ICD-10-CM | POA: Diagnosis not present

## 2023-09-07 NOTE — Assessment & Plan Note (Signed)
Improving.  Improvement in symptoms with Augmentin and Bactrim. Decreased tenderness and swelling in the left nostril and cheek. Drainage from the nostril has decreased and a scab has formed. -Continue Augmentin and Bactrim until completion. -If symptoms do not continue to improve, consider imaging.

## 2023-09-07 NOTE — Patient Instructions (Signed)
VISIT SUMMARY:  You were seen today for swelling and tenderness in your left cheek, which started with a sore in your left nostril. You have a history of a heart murmur, but no new issues were found related to it during this visit. Your symptoms have been improving with the medications prescribed.  YOUR PLAN:  -FACIAL CELLULITIS: Facial cellulitis is a bacterial skin infection causing redness, swelling, and tenderness. You have shown improvement with the antibiotics Augmentin and Bactrim. Please continue taking these medications until they are finished. If your symptoms do not keep getting better, we may need to do some imaging tests.  -HEART MURMUR: A heart murmur is an unusual sound heard between heartbeats. Your heart murmur has been previously evaluated and no significant issues were found, so no further action is needed at this time.  -GENERAL HEALTH MAINTENANCE: Please contact our office if there are any changes in your symptoms. Follow up as needed.  INSTRUCTIONS:  Please continue taking Augmentin and Bactrim until the course is completed. If your symptoms do not continue to improve, consider imaging. Contact the office if there are any changes in symptoms. Follow-up as needed.

## 2023-09-07 NOTE — Progress Notes (Signed)
Subjective:     Patient ID: Anita Cox, female    DOB: 04-05-1981, 42 y.o.   MRN: [id number]  Chief Complaint  Patient presents with   Cellulitis    Here for follow up    HPI  Discussed the use of AI scribe software for clinical note transcription with the patient, who gave verbal consent to proceed.  History of Present Illness   The patient, with a history of a heart murmur, presents with a recent episode of left cheek  cellulitis. The symptoms began with a sore in the left nostril, which progressively became more tender and developed clear bloody drainage. The cheek then started to swell, and the swelling extended to the area around the left eye. At the time, the patient was placed on Augmentin by another provider. Bactrim was added to the regimen on 12/19, and the patient notes significant improvement in the pain, redness and swelling since that time. The left nare is less tender now, and a hard, bloody scab has come out of the sore. The patient has been extra cautious about hygiene, avoiding touching or popping any pimples.   She notes that the abscess on her right lower trunk is also improved.          Health Maintenance Due  Topic Date Due   COVID-19 Vaccine (4 - 2024-25 season) 05/20/2023    Past Medical History:  Diagnosis Date   Acute bronchitis 12/27/2011   Anemia    Anxiety    Frequent UTI    Hay fever    with allergies   History of chicken pox    History of kidney stones    07/2016   Pre-diabetes     Past Surgical History:  Procedure Laterality Date   NO PAST SURGERIES     denies surgical history    Family History  Problem Relation Age of Onset   Arthritis Mother    Drug abuse Mother    Diabetes Other        grandmother   Diabetes Paternal Grandfather    Diabetes Paternal Grandmother    Lupus Half-Sister    OCD Half-Sister    Bipolar disorder Half-Sister    ADD / ADHD Half-Sister    Anxiety disorder Half-Brother    Irritable bowel  syndrome Half-Brother    Diabetes Mellitus II Maternal Grandmother    Urinary tract infection Maternal Grandmother    Alcohol abuse Maternal Grandfather     Social History   Socioeconomic History   Marital status: Married    Spouse name: Not on file   Number of children: Not on file   Years of education: Not on file   Highest education level: Associate degree: occupational, Scientist, product/process development, or vocational program  Occupational History   Not on file  Tobacco Use   Smoking status: Former    Current packs/day: 0.00    Types: Cigarettes    Quit date: 01/2022    Years since quitting: 1.6   Smokeless tobacco: Never  Vaping Use   Vaping status: Never Used  Substance and Sexual Activity   Alcohol use: Yes    Comment: rare wine   Drug use: No   Sexual activity: Not Currently  Other Topics Concern   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   Raised by her maternal GM- mother has drug abuse issues   One adopted son (biological nephew)   Married   4 cats  Enjoys reading, crafting, television   Cleaning/organizing   Social Drivers of Health   Financial Resource Strain: Medium Risk (09/05/2023)   Overall Financial Resource Strain (CARDIA)    Difficulty of Paying Living Expenses: Somewhat hard  Food Insecurity: No Food Insecurity (09/05/2023)   Hunger Vital Sign    Worried About Running Out of Food in the Last Year: Never true    Ran Out of Food in the Last Year: Never true  Transportation Needs: No Transportation Needs (09/05/2023)   PRAPARE - Administrator, Civil Service (Medical): No    Lack of Transportation (Non-Medical): No  Physical Activity: Unknown (09/05/2023)   Exercise Vital Sign    Days of Exercise per Week: 0 days    Minutes of Exercise per Session: Not on file  Stress: Stress Concern Present (09/05/2023)   [employer] of Occupational Health - Occupational Stress Questionnaire    Feeling of Stress :  Rather much  Social Connections: Moderately Isolated (09/05/2023)   Social Connection and Isolation Panel [NHANES]    Frequency of Communication with Friends and Family: Twice a week    Frequency of Social Gatherings with Friends and Family: Once a week    Attends Religious Services: Never    Database administrator or Organizations: No    Attends Engineer, structural: Not on file    Marital Status: Married  Catering manager Violence: Not on file    Outpatient Medications Prior to Visit  Medication Sig Dispense Refill   amoxicillin-clavulanate (AUGMENTIN) 875-125 MG tablet Take 1 tablet by mouth 2 (two) times daily. 14 tablet 0   baclofen (LIORESAL) 10 MG tablet Take 1 tablet (10 mg total) by mouth 3 (three) times daily. 30 each 0   clindamycin-benzoyl peroxide (BENZACLIN) gel Apply topically 2 (two) times daily. 25 g 2   clonazePAM (KLONOPIN) 0.5 MG tablet Take 0.5 mg by mouth 2 (two) times daily as needed.     escitalopram (LEXAPRO) 10 MG tablet TAKE 1 TABLET BY MOUTH EVERY DAY 90 tablet 1   etodolac (LODINE) 300 MG capsule Take 1 capsule (300 mg total) by mouth 2 (two) times daily with a meal. 20 capsule 0   sulfamethoxazole-trimethoprim (BACTRIM DS) 800-160 MG tablet Take 1 tablet by mouth 2 (two) times daily for 7 days. 14 tablet 0   TRI-PREVIFEM 0.18/0.215/0.25 MG-35 MCG tablet Take 1 tablet by mouth daily.  11   valACYclovir (VALTREX) 1000 MG tablet Take one tablet this AM and on tab this PM 6 tablet 0   No facility-administered medications prior to visit.    No Known Allergies  ROS     Objective:    Physical Exam Constitutional:      General: She is not in acute distress.    Appearance: Normal appearance. She is well-developed.  HENT:     Head: Normocephalic and atraumatic.     Right Ear: Tympanic membrane, ear canal and external ear normal.     Left Ear: Tympanic membrane, ear canal and external ear normal.     Mouth/Throat:     Pharynx: No posterior  oropharyngeal erythema.  Eyes:     General: No scleral icterus. Neck:     Thyroid: No thyromegaly.     Comments: Resolution of swelling and erythema of the left cheek Cardiovascular:     Rate and Rhythm: Normal rate and regular rhythm.     Heart sounds: Murmur heard.     Systolic murmur is present with a grade  of 2/6.  Pulmonary:     Effort: Pulmonary effort is normal. No respiratory distress.     Breath sounds: Normal breath sounds. No wheezing.  Musculoskeletal:     Cervical back: Neck supple.  Lymphadenopathy:     Cervical: No cervical adenopathy.  Skin:    General: Skin is warm and dry.     Comments: Decrease in size of abscess right lower trunk  Neurological:     Mental Status: She is alert and oriented to person, place, and time.  Psychiatric:        Mood and Affect: Mood normal.        Behavior: Behavior normal.        Thought Content: Thought content normal.        Judgment: Judgment normal.       BP 126/71 (BP Location: Right Arm, Patient Position: Sitting, Cuff Size: Large)   Pulse 79   Temp 98.5 F (36.9 C) (Oral)   Resp 16   Ht 5\' 1"  (1.549 m)   Wt 223 lb (101.2 kg)   SpO2 99%   BMI 42.14 kg/m  Wt Readings from Last 3 Encounters:  09/07/23 223 lb (101.2 kg)  09/05/23 223 lb (101.2 kg)  06/26/23 225 lb (102.1 kg)       Assessment & Plan:   Problem List Items Addressed This Visit       Unprioritized   Cutaneous abscess of trunk - Primary   Improved.  Monitor.      Cellulitis of external cheek, left   Improving.  Improvement in symptoms with Augmentin and Bactrim. Decreased tenderness and swelling in the left nostril and cheek. Drainage from the nostril has decreased and a scab has formed. -Continue Augmentin and Bactrim until completion. -If symptoms do not continue to improve, consider imaging.        I am having Kennetra Anagnostopoulos. Eyman maintain her clonazePAM, Tri-Previfem, valACYclovir, clindamycin-benzoyl peroxide, etodolac, baclofen,  escitalopram, amoxicillin-clavulanate, and sulfamethoxazole-trimethoprim.  No orders of the defined types were placed in this encounter.

## 2023-09-07 NOTE — Assessment & Plan Note (Signed)
Improved. Monitor.  

## 2023-09-26 ENCOUNTER — Telehealth: Payer: Managed Care, Other (non HMO) | Admitting: Family Medicine

## 2023-09-26 DIAGNOSIS — J3489 Other specified disorders of nose and nasal sinuses: Secondary | ICD-10-CM

## 2023-09-26 NOTE — Progress Notes (Signed)
 Because this has been an ongoing problem and you've already been on antibiotics, I am not able to help you by evisit, and I feel your condition warrants further evaluation and I recommend that you be seen in a face to face visit.   NOTE: There will be NO CHARGE for this eVisit   If you are having a true medical emergency please call 911.      For an urgent face to face visit, [facility] has eight urgent care centers for your convenience:   NEW!! [facility] at [facility] Get Driving Directions [phone] [address] [place], [address]    [facility] at [facility] Get Driving Directions [phone] [address] [place], [place] [address]   [facility] Urgent Care Center [facility]) Get Driving Directions [phone] [address] [place], [place] [address]  [facility] Urgent Care Center [facility]) Get Driving Directions [phone] [address] [place],  [place]  [address]  [facility] Health Urgent Care Center [facility] - at [employer]  [phone] [address] [place],  [place] [address]  [facility] Health Urgent Care at [facility] Get Driving Directions [phone] [address] [place], [place] [address]   [facility] Health Urgent Care at [facility] Get Driving Directions  [phone] [address] [place], [place] [address]   [facility] Health Urgent Care at [facility] Directions [phone] [address] [place], [place] [address]  Your MyChart E-visit questionnaire answers were reviewed by a board certified advanced clinical practitioner to complete your personal care plan based on your specific symptoms.  Thank you for using e-Visits.

## 2023-12-26 ENCOUNTER — Encounter: Payer: Self-pay | Admitting: Family

## 2023-12-26 MED ORDER — ESCITALOPRAM OXALATE 10 MG PO TABS: 10.0000 mg | ORAL_TABLET | Freq: Every day | ORAL | 0 refills | Status: DC

## 2024-02-01 ENCOUNTER — Ambulatory Visit (HOSPITAL_BASED_OUTPATIENT_CLINIC_OR_DEPARTMENT_OTHER): Admitting: Student

## 2024-02-01 ENCOUNTER — Ambulatory Visit (HOSPITAL_BASED_OUTPATIENT_CLINIC_OR_DEPARTMENT_OTHER)

## 2024-02-01 ENCOUNTER — Encounter (HOSPITAL_BASED_OUTPATIENT_CLINIC_OR_DEPARTMENT_OTHER): Payer: Self-pay | Admitting: Student

## 2024-02-01 DIAGNOSIS — M542 Cervicalgia: Secondary | ICD-10-CM

## 2024-02-01 DIAGNOSIS — M25512 Pain in left shoulder: Secondary | ICD-10-CM

## 2024-02-01 MED ORDER — TRIAMCINOLONE ACETONIDE 40 MG/ML IJ SUSP
2.0000 mL | INTRAMUSCULAR | Status: AC | PRN
  Administered 2024-02-01: 2 mL via INTRA_ARTICULAR

## 2024-02-01 MED ORDER — LIDOCAINE HCL 1 % IJ SOLN
4.0000 mL | INTRAMUSCULAR | Status: AC | PRN
  Administered 2024-02-01: 4 mL

## 2024-02-01 NOTE — Progress Notes (Signed)
 Chief Complaint: Left shoulder pain     History of Present Illness:    Anita Cox is a 43 y.o. right-hand-dominant female presenting to clinic today for evaluation of left shoulder pain.  She states that this began yesterday without any known cause or injury.  Pain is located mainly over the anterior aspect of the left shoulder and does not radiate past the elbow.  Describes the pain as throbbing and moderate in severity, which is beginning to affect her daily activities.  She does report some pain radiating up toward the neck.  She has tried Tylenol  and ibuprofen as well as heat and ice.  No numbness or tingling is present.  Denies any previous history of shoulder injury or pain.   Surgical History:   None  PMH/PSH/Family History/Social History/Meds/Allergies:    Past Medical History:  Diagnosis Date   Acute bronchitis 12/27/2011   Anemia    Anxiety    Frequent UTI    Hay fever    with allergies   History of chicken pox    History of kidney stones    07/2016   Pre-diabetes    Past Surgical History:  Procedure Laterality Date   NO PAST SURGERIES     denies surgical history   Social History   Socioeconomic History   Marital status: Married    Spouse name: Not on file   Number of children: Not on file   Years of education: Not on file   Highest education level: Associate degree: occupational, Scientist, product/process development, or vocational program  Occupational History   Not on file  Tobacco Use   Smoking status: Former    Current packs/day: 0.00    Types: Cigarettes    Quit date: 01/2022    Years since quitting: 2.0   Smokeless tobacco: Never  Vaping Use   Vaping status: Never Used  Substance and Sexual Activity   Alcohol use: Yes    Comment: rare wine   Drug use: No   Sexual activity: Not Currently  Other Topics Concern   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   Raised by her maternal GM-  mother has drug abuse issues   One adopted son (biological nephew)   Married   4 cats   Enjoys reading, Clinical cytogeneticist, television   Cleaning/organizing   Social Drivers of Health   Financial Resource Strain: Medium Risk (09/05/2023)   Overall Financial Resource Strain (CARDIA)    Difficulty of Paying Living Expenses: Somewhat hard  Food Insecurity: No Food Insecurity (09/05/2023)   Hunger Vital Sign    Worried About Running Out of Food in the Last Year: Never true    Ran Out of Food in the Last Year: Never true  Transportation Needs: No Transportation Needs (09/05/2023)   PRAPARE - Administrator, Civil Service (Medical): No    Lack of Transportation (Non-Medical): No  Physical Activity: Unknown (09/05/2023)   Exercise Vital Sign    Days of Exercise per Week: 0 days    Minutes of Exercise per Session: Not on file  Stress: Stress Concern Present (09/05/2023)   [employer] of Occupational Health - Occupational Stress Questionnaire    Feeling of Stress : Rather much  Social Connections: Moderately Isolated (09/05/2023)  Social Advertising account executive [NHANES]    Frequency of Communication with Friends and Family: Twice a week    Frequency of Social Gatherings with Friends and Family: Once a week    Attends Religious Services: Never    Database administrator or Organizations: No    Attends Engineer, structural: Not on file    Marital Status: Married   Family History  Problem Relation Age of Onset   Arthritis Mother    Drug abuse Mother    Diabetes Other        grandmother   Diabetes Paternal Grandfather    Diabetes Paternal Grandmother    Lupus Half-Sister    OCD Half-Sister    Bipolar disorder Half-Sister    ADD / ADHD Half-Sister    Anxiety disorder Half-Brother    Irritable bowel syndrome Half-Brother    Diabetes Mellitus II Maternal Grandmother    Urinary tract infection Maternal Grandmother    Alcohol abuse Maternal Grandfather    No  Known Allergies Current Outpatient Medications  Medication Sig Dispense Refill   amoxicillin -clavulanate (AUGMENTIN ) 875-125 MG tablet Take 1 tablet by mouth 2 (two) times daily. 14 tablet 0   baclofen  (LIORESAL ) 10 MG tablet Take 1 tablet (10 mg total) by mouth 3 (three) times daily. 30 each 0   clindamycin -benzoyl peroxide (BENZACLIN) gel Apply topically 2 (two) times daily. 25 g 2   clonazePAM (KLONOPIN) 0.5 MG tablet Take 0.5 mg by mouth 2 (two) times daily as needed.     escitalopram  (LEXAPRO ) 10 MG tablet Take 1 tablet (10 mg total) by mouth daily. 90 tablet 0   etodolac  (LODINE ) 300 MG capsule Take 1 capsule (300 mg total) by mouth 2 (two) times daily with a meal. 20 capsule 0   TRI-PREVIFEM 0.18/0.215/0.25 MG-35 MCG tablet Take 1 tablet by mouth daily.  11   valACYclovir  (VALTREX ) 1000 MG tablet Take one tablet this AM and on tab this PM 6 tablet 0   No current facility-administered medications for this visit.   No results found.  Review of Systems:   A ROS was performed including pertinent positives and negatives as documented in the HPI.  Physical Exam :   Constitutional: NAD and appears stated age Neurological: Alert and oriented Psych: Appropriate affect and cooperative There were no vitals taken for this visit.   Comprehensive Musculoskeletal Exam:    Left shoulder exam demonstrates tenderness with palpation over the anterior aspect of the glenohumeral joint.  Active range of motion to 160 degrees forward flexion, 30 degrees external rotation, and internal rotation to L4 bilaterally.  Pain is noted in terminal flexion and external rotation.  Negative empty can, Neer, Hawkins, and speeds.  No midline or cervical paraspinal tenderness.  Full cervical ROM in all planes with negative Spurling's.  Grip strength 5/5 bilaterally.  Imaging:   Xray (left shoulder 3 views): Small calcification noted within the subacromial space that is chronic appearing in nature.  No other evidence  of bony abnormality.  Xray (cervical spine 4 views): Normal-appearing cervical spine radiographs   I personally reviewed and interpreted the radiographs.   Assessment:   43 y.o. female with 1 day history of atraumatic left shoulder pain.  Pain is located mainly over the anterior glenohumeral joint, but does radiate somewhat up towards the neck and anteriorly down the arm toward the elbow.  Given the nature and location of pain particularly in flexion and external rotation, I do have some suspicion for developing adhesive  capsulitis.  Also consider involvement of bicep tendinitis although this was grossly normal appearing on ultrasound with negative special testing.  I have offered a intra-articular cortisone injection today for symptomatic relief, which she would like to proceed with.  Injection was performed to the left glenohumeral joint which she tolerated well.  Will plan to assess relief over the coming days and weeks, and would recommend to follow-up if symptoms persist or worsen.  Plan :    - Left shoulder glenohumeral cortisone injection performed today - Return to clinic as needed    Procedure Note  Patient: Anita Cox             Date of Birth: 1981/06/30           MRN: [id number]             Visit Date: 02/01/2024  Procedures: Visit Diagnoses:  1. Acute pain of left shoulder   2. Neck pain     Large Joint Inj: L glenohumeral on 02/01/2024 3:12 PM Indications: pain Details: 22 G 1.5 in needle, anterior approach Medications: 4 mL lidocaine 1 %; 2 mL triamcinolone acetonide 40 MG/ML Outcome: tolerated well, no immediate complications Procedure, treatment alternatives, risks and benefits explained, specific risks discussed. Consent was given by the patient. Immediately prior to procedure a time out was called to verify the correct patient, procedure, equipment, support staff and site/side marked as required. Patient was prepped and draped in the usual sterile fashion.        I personally saw and evaluated the patient, and participated in the management and treatment plan.  Sharrell Deck, PA-C Orthopedics

## 2024-03-17 ENCOUNTER — Encounter: Payer: Self-pay | Admitting: Family

## 2024-03-17 DIAGNOSIS — L02219 Cutaneous abscess of trunk, unspecified: Secondary | ICD-10-CM

## 2024-03-18 MED ORDER — SULFAMETHOXAZOLE-TRIMETHOPRIM 800-160 MG PO TABS: 1.0000 | ORAL_TABLET | Freq: Two times a day (BID) | ORAL | 0 refills | Status: DC

## 2024-03-18 NOTE — Telephone Encounter (Signed)

## 2024-03-18 NOTE — Addendum Note (Signed)
 Addended by: DARYL SETTER on: 03/18/2024 08:44 AM   Modules accepted: Orders

## 2024-03-19 ENCOUNTER — Ambulatory Visit: Admitting: Family

## 2024-03-19 NOTE — Telephone Encounter (Signed)
 Talked to patient and she reports abscess is draining and feeling  a little bit better, se declines follow up at this time

## 2024-03-20 ENCOUNTER — Ambulatory Visit: Admitting: Family Medicine

## 2024-03-20 VITALS — BP 121/80 | HR 80 | Temp 98.0°F | Ht 61.0 in | Wt 225.0 lb

## 2024-03-20 DIAGNOSIS — L0291 Cutaneous abscess, unspecified: Secondary | ICD-10-CM | POA: Diagnosis not present

## 2024-03-20 MED ORDER — CHLORHEXIDINE GLUCONATE 4 % EX SOLN: CUTANEOUS | 2 refills | Status: AC

## 2024-03-20 MED ORDER — MUPIROCIN 2 % EX OINT: 1.0000 | TOPICAL_OINTMENT | Freq: Two times a day (BID) | CUTANEOUS | 0 refills | Status: AC

## 2024-03-20 NOTE — Progress Notes (Signed)
 Acute Office Visit  Subjective:     Patient ID: Anita Cox, female    DOB: 1981/02/28, 43 y.o.   MRN: [id number]  Chief Complaint  Patient presents with   Abscess     Patient is in today for abscess.  Discussed the use of AI scribe software for clinical note transcription with the patient, who gave verbal consent to proceed.  History of Present Illness Anita Cox is a 43 year old female who presents with abscesses on her right upper leg and left buttock.  She has developed an abscess on her right upper leg and another on her left buttock. The abscess on her right leg opened overnight and is draining, described as a 'nasty wound', and she has been using dressings to manage the drainage. The abscess on her left buttock is painful and increasing in size, similar to the initial presentation of the right leg abscess.  She recalls a previous episode several months ago when she was treated for MRSA with Bactrim  due to a sore in her nose. She is currently on Bactrim  again, having started the course on July 1st, and has taken four doses so far. The swelling in the right leg abscess has decreased, and she has not experienced systemic symptoms such as fever or chills.  She mentions a history of acne but states that she has never experienced abscesses like this before. She also notes a recent gallbladder removal approximately two months ago, which involved a hospital stay.  In terms of her social history, she lives with her husband, who has been sleeping on the couch to avoid potential MRSA transmission. She is diligent about cleaning shared spaces, such as the toilet, to prevent spreading the infection.        ROS All review of systems negative except what is listed in the HPI      Objective:    BP 121/80   Pulse 80   Temp 98 F (36.7 C) (Oral)   Ht 5' 1 (1.549 m)   Wt 225 lb (102.1 kg)   SpO2 97%   BMI 42.51 kg/m    Physical Exam Vitals reviewed.   Constitutional:      Appearance: Normal appearance.  Skin:    General: Skin is warm and dry.  Neurological:     Mental Status: She is alert and oriented to person, place, and time.  Psychiatric:        Mood and Affect: Mood normal.        Behavior: Behavior normal.        Thought Content: Thought content normal.        Judgment: Judgment normal.    Left buttock abscess: erythematous, warm, tender, indurated, but no fluctuance or drainage     Right upper leg abscess: erythematous, tender, warm, indurated, open/draining, no fluctuance     No results found for any visits on 03/20/24.      Assessment & Plan:   Problem List Items Addressed This Visit   None Visit Diagnoses       Abscess    -  Primary   Relevant Medications   chlorhexidine (HIBICLENS) 4 % external liquid   mupirocin ointment (BACTROBAN) 2 %       Assessment & Plan Abscesses on right upper leg and left buttock, possibly MRSA-related. Right leg abscess draining, left buttock abscess enlarging. On Bactrim , effective against MRSA. No systemic symptoms. Culture not performed due to ongoing antibiotic treatment. - Continue Bactrim  for 7  days. - Apply warm compresses to affected areas 4-5 times daily. - Use mupirocin ointment intranasally and chlorhexidine washes for decolonization given second possible MRSA infection this year. - Wash sheets in hot water, maintain hygiene to prevent spread. - Monitor left buttock abscess for fluctuance development; consider f/u for lancing if no spontaneous drainage. Patient aware of signs/symptoms requiring further/urgent evaluation.      Meds ordered this encounter  Medications   chlorhexidine (HIBICLENS) 4 % external liquid    Sig: Apply all over body avoiding face daily for 2 weeks.  Let sit 5 mins before rinsing off.    Dispense:  1000 mL    Refill:  2    Supervising Provider:   DOMENICA BLACKBIRD A [[id number]]   mupirocin ointment (BACTROBAN) 2 %    Sig: Apply 1  Application topically 2 (two) times daily for 10 days. Apply in both nares twice daily for 10 days.    Dispense:  22 g    Refill:  0    Supervising Provider:   DOMENICA BLACKBIRD A [[id number]]    Return if symptoms worsen or fail to improve.  Waddell KATHEE Mon, NP

## 2024-03-21 ENCOUNTER — Other Ambulatory Visit: Payer: Self-pay | Admitting: Family

## 2024-03-28 ENCOUNTER — Other Ambulatory Visit: Payer: Self-pay | Admitting: Family

## 2024-03-28 DIAGNOSIS — B001 Herpesviral vesicular dermatitis: Secondary | ICD-10-CM

## 2024-04-01 NOTE — Telephone Encounter (Signed)
 Could be due to hemorrhoids or other cause. Please schedule OV.  If severe bleeding occurs she should go to the ER.

## 2024-04-30 ENCOUNTER — Encounter: Payer: Self-pay | Admitting: Family

## 2024-06-18 ENCOUNTER — Other Ambulatory Visit: Payer: Self-pay | Admitting: Family

## 2024-07-04 ENCOUNTER — Ambulatory Visit: Admitting: Family

## 2024-07-04 VITALS — BP 138/69 | HR 82 | Temp 98.5°F | Resp 16 | Ht 61.0 in | Wt 234.0 lb

## 2024-07-04 DIAGNOSIS — B001 Herpesviral vesicular dermatitis: Secondary | ICD-10-CM | POA: Diagnosis not present

## 2024-07-04 DIAGNOSIS — Z23 Encounter for immunization: Secondary | ICD-10-CM | POA: Diagnosis not present

## 2024-07-04 DIAGNOSIS — R5383 Other fatigue: Secondary | ICD-10-CM | POA: Diagnosis not present

## 2024-07-04 DIAGNOSIS — Z8619 Personal history of other infectious and parasitic diseases: Secondary | ICD-10-CM | POA: Diagnosis not present

## 2024-07-04 MED ORDER — VALACYCLOVIR HCL 1 G PO TABS: ORAL_TABLET | ORAL | 2 refills | Status: AC

## 2024-07-04 NOTE — Assessment & Plan Note (Signed)
 Refill provided for prn valtres.

## 2024-07-04 NOTE — Assessment & Plan Note (Signed)
  Chronic fatigue with suspicion of sleep apnea. Differential includes anemia, thyroid dysfunction, and vitamin D deficiency. - Refer to sleep specialist for consultation and home sleep study. - Order lab tests for anemia, thyroid function, and vitamin D levels. - Discuss potential treatment options including CPAP and weight loss. - Consider Zepbound if sleep apnea confirmed and insurance allows.

## 2024-07-04 NOTE — Progress Notes (Signed)
 Subjective:     Patient ID: Anita Cox, female    DOB: 1981-09-05, 43 y.o.   MRN: [id number]  Chief Complaint  Patient presents with   Fatigue    Patient complains of feeling very tired and sleepy during the day    HPI  Discussed the use of AI scribe software for clinical note transcription with the patient, who gave verbal consent to proceed.  History of Present Illness  Anita Cox is a 43 year old female who presents with extreme exhaustion and daytime sleepiness.  She has experienced extreme exhaustion and difficulty staying awake during the day for the past two to three years, with a recent worsening of symptoms. She struggles to stay awake while driving or working, which she finds concerning. She snores but is unaware of any apnea episodes during sleep. There is no family history of sleep apnea.  She joined a gym four weeks ago and has gained approximately six pounds since, without significant dietary changes. She exercises with her 87 year old son, though she experiences knee pain while jogging.  Her current medications include clonazepam, Lexapro , and Valtrex  as needed for cold sores. Her mood and anxiety are stable on Lexapro . She has experienced dizziness with multivitamins in the past.     Health Maintenance Due  Topic Date Due   Hepatitis B Vaccines 19-59 Average Risk (1 of 3 - 19+ 3-dose series) Never done   HPV VACCINES (1 - 3-dose SCDM series) Never done   Influenza Vaccine  04/18/2024   COVID-19 Vaccine (4 - 2025-26 season) 05/19/2024    Past Medical History:  Diagnosis Date   Acute bronchitis 12/27/2011   Anemia    Anxiety    Frequent UTI    Hay fever    with allergies   History of chicken pox    History of kidney stones    07/2016   Pre-diabetes     Past Surgical History:  Procedure Laterality Date   NO PAST SURGERIES     denies surgical history    Family History  Problem Relation Age of Onset   Arthritis Mother    Drug abuse  Mother    Diabetes Other        grandmother   Diabetes Paternal Grandfather    Diabetes Paternal Grandmother    Lupus Half-Sister    OCD Half-Sister    Bipolar disorder Half-Sister    ADD / ADHD Half-Sister    Anxiety disorder Half-Brother    Irritable bowel syndrome Half-Brother    Diabetes Mellitus II Maternal Grandmother    Urinary tract infection Maternal Grandmother    Alcohol abuse Maternal Grandfather     Social History   Socioeconomic History   Marital status: Married    Spouse name: Not on file   Number of children: Not on file   Years of education: Not on file   Highest education level: Associate degree: occupational, Scientist, product/process development, or vocational program  Occupational History   Not on file  Tobacco Use   Smoking status: Former    Current packs/day: 0.00    Types: Cigarettes    Quit date: 01/2022    Years since quitting: 2.4   Smokeless tobacco: Never  Vaping Use   Vaping status: Never Used  Substance and Sexual Activity   Alcohol use: Yes    Comment: rare wine   Drug use: No   Sexual activity: Not Currently  Other Topics Concern   Not on file  Social History Narrative  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   Raised by her maternal GM- mother has drug abuse issues   One adopted son (biological nephew)   Married   4 cats   Enjoys reading, Clinical cytogeneticist, Horticulturist, commercial   Social Drivers of Health   Financial Resource Strain: Low Risk  (03/20/2024)   Overall Financial Resource Strain (CARDIA)    Difficulty of Paying Living Expenses: Not very hard  Food Insecurity: No Food Insecurity (03/20/2024)   Hunger Vital Sign    Worried About Running Out of Food in the Last Year: Never true    Ran Out of Food in the Last Year: Never true  Transportation Needs: No Transportation Needs (03/20/2024)   PRAPARE - Administrator, Civil Service (Medical): No    Lack of Transportation (Non-Medical): No  Physical Activity: Inactive (03/20/2024)    Exercise Vital Sign    Days of Exercise per Week: 0 days    Minutes of Exercise per Session: Not on file  Stress: Stress Concern Present (03/20/2024)   [employer] of Occupational Health - Occupational Stress Questionnaire    Feeling of Stress: Rather much  Social Connections: Moderately Isolated (03/20/2024)   Social Connection and Isolation Panel    Frequency of Communication with Friends and Family: Twice a week    Frequency of Social Gatherings with Friends and Family: Once a week    Attends Religious Services: Never    Database administrator or Organizations: No    Attends Engineer, structural: Not on file    Marital Status: Married  Catering manager Violence: Not on file    Outpatient Medications Prior to Visit  Medication Sig Dispense Refill   baclofen  (LIORESAL ) 10 MG tablet Take 1 tablet (10 mg total) by mouth 3 (three) times daily. 30 each 0   chlorhexidine  (HIBICLENS ) 4 % external liquid Apply all over body avoiding face daily for 2 weeks.  Let sit 5 mins before rinsing off. 1000 mL 2   clindamycin -benzoyl peroxide (BENZACLIN) gel Apply topically 2 (two) times daily. 25 g 2   clonazePAM (KLONOPIN) 0.5 MG tablet Take 0.5 mg by mouth 2 (two) times daily as needed.     escitalopram  (LEXAPRO ) 10 MG tablet Take 1 tablet (10 mg total) by mouth daily. Needs appt 30 tablet 0   TRI-PREVIFEM 0.18/0.215/0.25 MG-35 MCG tablet Take 1 tablet by mouth daily.  11   sulfamethoxazole -trimethoprim  (BACTRIM  DS) 800-160 MG tablet Take 1 tablet by mouth 2 (two) times daily. 14 tablet 0   valACYclovir  (VALTREX ) 1000 MG tablet Take 2 tabs at start of cold sore and 2 tabs 4 hours later 4 tablet 2   etodolac  (LODINE ) 300 MG capsule Take 1 capsule (300 mg total) by mouth 2 (two) times daily with a meal. 20 capsule 0   No facility-administered medications prior to visit.    No Known Allergies  ROS    See HPI Objective:    Physical Exam Constitutional:      General: She is not in  acute distress.    Appearance: Normal appearance. She is well-developed.  HENT:     Head: Normocephalic and atraumatic.     Right Ear: External ear normal.     Left Ear: External ear normal.     Mouth/Throat:     Comments: Narrow posterior oropharynx Eyes:     General: No scleral icterus. Neck:     Thyroid: No thyromegaly.  Cardiovascular:  Rate and Rhythm: Normal rate and regular rhythm.     Heart sounds: Normal heart sounds. No murmur heard. Pulmonary:     Effort: Pulmonary effort is normal. No respiratory distress.     Breath sounds: Normal breath sounds. No wheezing.  Musculoskeletal:     Cervical back: Neck supple.  Skin:    General: Skin is warm and dry.  Neurological:     Mental Status: She is alert and oriented to person, place, and time.  Psychiatric:        Mood and Affect: Mood normal.        Behavior: Behavior normal.        Thought Content: Thought content normal.        Judgment: Judgment normal.      BP 138/69 (BP Location: Right Arm, Patient Position: Sitting, Cuff Size: Large)   Pulse 82   Temp 98.5 F (36.9 C) (Oral)   Resp 16   Ht 5' 1 (1.549 m)   Wt 234 lb (106.1 kg)   SpO2 97%   BMI 44.21 kg/m  Wt Readings from Last 3 Encounters:  07/04/24 234 lb (106.1 kg)  03/20/24 225 lb (102.1 kg)  09/07/23 223 lb (101.2 kg)       Assessment & Plan:   Problem List Items Addressed This Visit       Unprioritized   History of cold sores   Refill provided for prn valtres.       Relevant Medications   valACYclovir  (VALTREX ) 1000 MG tablet   Fatigue    Chronic fatigue with suspicion of sleep apnea. Differential includes anemia, thyroid dysfunction, and vitamin D deficiency. - Refer to sleep specialist for consultation and home sleep study. - Order lab tests for anemia, thyroid function, and vitamin D levels. - Discuss potential treatment options including CPAP and weight loss. - Consider Zepbound if sleep apnea confirmed and insurance  allows.      Relevant Orders   B12   CBC w/Diff   Comp Met (CMET)   TSH   Vitamin D (25 hydroxy)   Ambulatory referral to Pulmonology   Other Visit Diagnoses       Needs flu shot    -  Primary   Relevant Orders   Flu vaccine trivalent PF, 6mos and older(Flulaval,Afluria,Fluarix,Fluzone)       I have discontinued Anita GRADE. Mancha's etodolac  and sulfamethoxazole -trimethoprim . I am also having her maintain her clonazePAM, Tri-Previfem, clindamycin -benzoyl peroxide, baclofen , chlorhexidine , escitalopram , and valACYclovir .  Meds ordered this encounter  Medications   valACYclovir  (VALTREX ) 1000 MG tablet    Sig: Take 2 tabs at start of cold sore and 2 tabs 4 hours later    Dispense:  4 tablet    Refill:  2    Supervising Provider:   DOMENICA BLACKBIRD A [[id number]]

## 2024-07-04 NOTE — Patient Instructions (Signed)
 VISIT SUMMARY:  Today, we discussed your extreme exhaustion and daytime sleepiness, which have been worsening recently. We also reviewed your current medications and general health maintenance needs.  YOUR PLAN:  EXTREME FATIGUE, EVALUATION FOR SLEEP APNEA: You have been experiencing extreme fatigue and difficulty staying awake during the day, which may be due to sleep apnea or other conditions like anemia, thyroid dysfunction, or vitamin D deficiency. -You will be referred to a sleep specialist for a consultation and a home sleep study. -We will order lab tests to check for anemia, thyroid function, and vitamin D levels. -We discussed potential treatment options, including CPAP therapy and weight loss. -If sleep apnea is confirmed and insurance allows, we may consider Zepbound as a treatment option.  DEPRESSION AND ANXIETY DISORDER: Your mood and anxiety are well-controlled with Lexapro . -Continue taking Lexapro  as prescribed.  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 -Your Valtrex  prescription has been refilled.  GENERAL HEALTH MAINTENANCE: You are due for a flu vaccination and we need to review your vaccination history for hepatitis B and HPV Gardasil. -You will receive a flu shot today. -We will review your vaccination records for hepatitis B and HPV Gardasil series.

## 2024-07-05 LAB — COMPREHENSIVE METABOLIC PANEL WITH GFR
AG Ratio: 1.1 (calc) (ref 1.0–2.5)
ALT: 11 U/L (ref 6–29)
AST: 10 U/L (ref 10–30)
Albumin: 3.7 g/dL (ref 3.6–5.1)
Alkaline phosphatase (APISO): 70 U/L (ref 31–125)
BUN: 14 mg/dL (ref 7–25)
CO2: 24 mmol/L (ref 20–32)
Calcium: 8.8 mg/dL (ref 8.6–10.2)
Chloride: 102 mmol/L (ref 98–110)
Creat: 0.64 mg/dL (ref 0.50–0.99)
Globulin: 3.3 g/dL (ref 1.9–3.7)
Glucose, Bld: 232 mg/dL — ABNORMAL HIGH (ref 65–99)
Potassium: 3.9 mmol/L (ref 3.5–5.3)
Sodium: 135 mmol/L (ref 135–146)
Total Bilirubin: 0.2 mg/dL (ref 0.2–1.2)
Total Protein: 7 g/dL (ref 6.1–8.1)
eGFR: 112 mL/min/1.73m2 (ref >=60)

## 2024-07-05 LAB — CBC WITH DIFFERENTIAL/PLATELET
Absolute Lymphocytes: 4203 {cells}/uL — ABNORMAL HIGH (ref 850–3900)
Absolute Monocytes: 878 {cells}/uL (ref 200–950)
Basophils Absolute: 53 {cells}/uL (ref 0–200)
Basophils Relative: 0.4 %
Eosinophils Absolute: 346 {cells}/uL (ref 15–500)
Eosinophils Relative: 2.6 %
HCT: 37.8 % (ref 35.0–45.0)
Hemoglobin: 12.6 g/dL (ref 11.7–15.5)
MCH: 29.9 pg (ref 27.0–33.0)
MCHC: 33.3 g/dL (ref 32.0–36.0)
MCV: 89.8 fL (ref 80.0–100.0)
MPV: 10.5 fL (ref 7.5–12.5)
Monocytes Relative: 6.6 %
Neutro Abs: 7820 {cells}/uL — ABNORMAL HIGH (ref 1500–7800)
Neutrophils Relative %: 58.8 %
Platelets: 286 Thousand/uL (ref 140–400)
RBC: 4.21 Million/uL (ref 3.80–5.10)
RDW: 13 % (ref 11.0–15.0)
Total Lymphocyte: 31.6 %
WBC: 13.3 Thousand/uL — ABNORMAL HIGH (ref 3.8–10.8)

## 2024-07-05 LAB — TSH: TSH: 1.09 m[IU]/L

## 2024-07-05 LAB — VITAMIN B12: Vitamin B-12: 547 pg/mL (ref 200–1100)

## 2024-07-05 LAB — VITAMIN D 25 HYDROXY (VIT D DEFICIENCY, FRACTURES): Vit D, 25-Hydroxy: 16 ng/mL — ABNORMAL LOW (ref 30–100)

## 2024-07-06 ENCOUNTER — Ambulatory Visit: Payer: Self-pay | Admitting: Family

## 2024-07-06 DIAGNOSIS — E559 Vitamin D deficiency, unspecified: Secondary | ICD-10-CM

## 2024-07-06 DIAGNOSIS — D72829 Elevated white blood cell count, unspecified: Secondary | ICD-10-CM

## 2024-07-06 DIAGNOSIS — R739 Hyperglycemia, unspecified: Secondary | ICD-10-CM

## 2024-07-06 MED ORDER — VITAMIN D (ERGOCALCIFEROL) 1.25 MG (50000 UNIT) PO CAPS: 50000.0000 [IU] | ORAL_CAPSULE | ORAL | 0 refills | Status: AC

## 2024-07-06 NOTE — Telephone Encounter (Signed)
 She is not anemic but white blood cel count and neutrophils were elevated- this can be do to infections such as viral infections, have she felt like she has  been fighting anything lately? That could be a contributor to your fatigue.  Let's repeat her cbc in 1 week.  Sugar is in the diabetes range.  I would like to check an A1C when she comes back to the lab.  In the meantime, please work on avoiding concentrated sweets, and limiting white carbs (rice/bread/pasta/potatoes).  Instead substitute whole grain versions with reasonable portions.  Thyroid testing is normal.  Vitamin D level is low.  Advise patient to begin vit D 50000 units once weekly for 12 weeks.  B12 is normal.

## 2024-07-08 NOTE — Telephone Encounter (Signed)
 Patient notified of results and new medication, scheduled to come in Monday for labs.

## 2024-07-13 ENCOUNTER — Other Ambulatory Visit: Payer: Self-pay | Admitting: Family

## 2024-07-14 ENCOUNTER — Other Ambulatory Visit (INDEPENDENT_AMBULATORY_CARE_PROVIDER_SITE_OTHER)

## 2024-07-14 ENCOUNTER — Ambulatory Visit: Payer: Self-pay | Admitting: Family

## 2024-07-14 DIAGNOSIS — E119 Type 2 diabetes mellitus without complications: Secondary | ICD-10-CM | POA: Insufficient documentation

## 2024-07-14 DIAGNOSIS — R739 Hyperglycemia, unspecified: Secondary | ICD-10-CM

## 2024-07-14 DIAGNOSIS — D72829 Elevated white blood cell count, unspecified: Secondary | ICD-10-CM

## 2024-07-14 LAB — CBC WITH DIFFERENTIAL/PLATELET
Basophils Absolute: 0 K/uL (ref 0.0–0.1)
Basophils Relative: 0.5 % (ref 0.0–3.0)
Eosinophils Absolute: 0.3 K/uL (ref 0.0–0.7)
Eosinophils Relative: 2.8 % (ref 0.0–5.0)
HCT: 40 % (ref 36.0–46.0)
Hemoglobin: 13.3 g/dL (ref 12.0–15.0)
Lymphocytes Relative: 33.6 % (ref 12.0–46.0)
Lymphs Abs: 3.6 K/uL (ref 0.7–4.0)
MCHC: 33.2 g/dL (ref 30.0–36.0)
MCV: 87.4 fl (ref 78.0–100.0)
Monocytes Absolute: 0.5 K/uL (ref 0.1–1.0)
Monocytes Relative: 4.8 % (ref 3.0–12.0)
Neutro Abs: 6.2 K/uL (ref 1.4–7.7)
Neutrophils Relative %: 58.3 % (ref 43.0–77.0)
Platelets: 295 K/uL (ref 150.0–400.0)
RBC: 4.57 Mil/uL (ref 3.87–5.11)
RDW: 14.1 % (ref 11.5–15.5)
WBC: 10.6 K/uL — ABNORMAL HIGH (ref 4.0–10.5)

## 2024-07-14 LAB — HEMOGLOBIN A1C: Hgb A1c MFr Bld: 7.3 % — ABNORMAL HIGH (ref 4.6–6.5)

## 2024-07-14 MED ORDER — TIRZEPATIDE 2.5 MG/0.5ML ~~LOC~~ SOAJ: 2.5000 mg | SUBCUTANEOUS | 0 refills | Status: DC

## 2024-07-14 NOTE — Telephone Encounter (Signed)
 White blood cell count is mildly elevated but has improved since last check.  Sugar has risen into diabetes range.   I would like to see if we can get Mounjaro approved to help with her sugar and weight management.  It is a once weekly injection.  If she is able to get it approved, please send me an update in a few weeks to let me know how she is feeling. Mild nausea and constipation is normal when this medication is first started.

## 2024-07-15 NOTE — Progress Notes (Signed)
 Patient notified of results and new medication. Waiting for approval, she will report how she is feeling on medication once she starts.

## 2024-07-17 ENCOUNTER — Ambulatory Visit: Admitting: Sleep Medicine

## 2024-07-21 ENCOUNTER — Encounter: Payer: Self-pay | Admitting: Radiology

## 2024-07-24 ENCOUNTER — Ambulatory Visit (INDEPENDENT_AMBULATORY_CARE_PROVIDER_SITE_OTHER): Admitting: Sleep Medicine

## 2024-07-24 ENCOUNTER — Encounter: Payer: Self-pay | Admitting: Sleep Medicine

## 2024-07-24 VITALS — BP 118/70 | HR 64 | Temp 97.7°F | Ht 61.0 in | Wt 226.0 lb

## 2024-07-24 DIAGNOSIS — G4733 Obstructive sleep apnea (adult) (pediatric): Secondary | ICD-10-CM | POA: Diagnosis not present

## 2024-07-24 NOTE — Progress Notes (Signed)
 Name:Anita Cox MRN: [id number] DOB: 08/25/1981   CHIEF COMPLAINT:  EXCESSIVE DAYTIME SLEEPINESS   HISTORY OF PRESENT ILLNESS: Anita Cox is a 43 y.o. w/ a h/o anxiety, depression and morbid obesity who present for c/o loud snoring and excessive daytime sleepiness which has been present for several years. Reports nocturnal awakenings due to unclear reasons, however does not have difficulty falling back to sleep. Reports significant weight changes. Admits to dry mouth and night sweats. Denies morning headaches, RLS symptoms, dream enactment, cataplexy, hypnagogic or hypnapompic hallucinations. Denies a family history of sleep apnea. Reports drowsy driving. Drinks 1-2 cups of coffee daily, denies alcohol, tobacco or illicit drug use.   Bedtime 10:30 pm Sleep onset 15 mins Rise time 5:45-6 am   EPWORTH SLEEP SCORE 15    07/24/2024    8:00 AM  Results of the Epworth flowsheet  Sitting and reading 3  Watching TV 1  Sitting, inactive in a public place (e.g. a theatre or a meeting) 2  As a passenger in a car for an hour without a break 3  Lying down to rest in the afternoon when circumstances permit 3  Sitting and talking to someone 0  Sitting quietly after a lunch without alcohol 1  In a car, while stopped for a few minutes in traffic 2  Total score 15    PAST MEDICAL HISTORY :   has a past medical history of Acute bronchitis (12/27/2011), Anemia, Anxiety, Diabetes mellitus without complication (HCC), Frequent UTI, Hay fever, History of chicken pox, History of kidney stones, and Pre-diabetes.  has a past surgical history that includes No past surgeries. Prior to Admission medications   Medication Sig Start Date End Date Taking? Authorizing Provider  baclofen  (LIORESAL ) 10 MG tablet Take 1 tablet (10 mg total) by mouth 3 (three) times daily. 06/20/23  Yes Crain, Whitney L, PA  cetirizine (ZYRTEC) 10 MG tablet Take 10 mg by mouth daily.   Yes [provider]   chlorhexidine  (HIBICLENS ) 4 % external liquid Apply all over body avoiding face daily for 2 weeks.  Let sit 5 mins before rinsing off. 03/20/24  Yes Almarie Waddell NOVAK, NP  clindamycin -benzoyl peroxide (BENZACLIN) gel Apply topically 2 (two) times daily. 04/20/23  Yes O'Sullivan, Melissa, NP  clonazePAM (KLONOPIN) 0.5 MG tablet Take 0.5 mg by mouth 2 (two) times daily as needed.   Yes [provider]  escitalopram  (LEXAPRO ) 10 MG tablet TAKE 1 TABLET (10 MG TOTAL) BY MOUTH DAILY. NEEDS APPT 07/13/24  Yes O'Sullivan, Melissa, NP  metFORMIN (GLUCOPHAGE) 500 MG tablet Take 1 tablet by mouth 2 (two) times daily. 06/09/22  Yes [provider]  tirzepatide CLOYDE) 2.5 MG/0.5ML Pen Inject 2.5 mg into the skin once a week. 07/14/24  Yes Daryl Setter, NP  TRI-PREVIFEM 0.18/0.215/0.25 MG-35 MCG tablet Take 1 tablet by mouth daily. 07/06/16  Yes [provider]  valACYclovir  (VALTREX ) 1000 MG tablet Take 2 tabs at start of cold sore and 2 tabs 4 hours later 07/04/24  Yes O'Sullivan, Melissa, NP  Vitamin D, Ergocalciferol, (DRISDOL) 1.25 MG (50000 UNIT) CAPS capsule Take 1 capsule (50,000 Units total) by mouth every 7 (seven) days. 07/06/24  Yes O'Sullivan, Melissa, NP   Allergies  Allergen Reactions   Pollen Extract Other (See Comments)    Runny or stuffy nose, sneezing    FAMILY HISTORY:  family history includes ADD / ADHD in her half-sister and sister; Alcohol abuse in her maternal grandfather; Anxiety disorder  in her brother, half-brother, and sister; Arthritis in her mother; Bipolar disorder in her half-sister; Diabetes in her paternal grandfather, paternal grandmother, and another family member; Diabetes Mellitus II in her maternal grandmother; Drug abuse in her mother and sister; Irritable bowel syndrome in her half-brother; Lupus in her half-sister; OCD in her half-sister; Urinary tract infection in her maternal grandmother. SOCIAL HISTORY:  reports that she quit smoking  about 2 years ago. Her smoking use included cigarettes. She has never used smokeless tobacco. She reports current alcohol use. She reports that she does not use drugs.   Review of Systems:  Gen:  Denies  fever, sweats, chills weight loss  HEENT: Denies blurred vision, double vision, ear pain, eye pain, hearing loss, nose bleeds, sore throat Cardiac:  No dizziness, chest pain or heaviness, chest tightness,edema, No JVD Resp:   No cough, -sputum production, -shortness of breath,-wheezing, -hemoptysis,  Gi: Denies swallowing difficulty, stomach pain, nausea or vomiting, diarrhea, constipation, bowel incontinence Gu:  Denies bladder incontinence, burning urine Ext:   Denies Joint pain, stiffness or swelling Skin: Denies  skin rash, easy bruising or bleeding or hives Endoc:  Denies polyuria, polydipsia , polyphagia or weight change Psych:   Denies depression, insomnia or hallucinations  Other:  All other systems negative  VITAL SIGNS: BP 118/70   Pulse 64   Temp 97.7 F (36.5 C)   Ht 5' 1 (1.549 m)   Wt 226 lb (102.5 kg)   LMP  (LMP Unknown)   SpO2 97%   BMI 42.70 kg/m    Physical Examination:   General Appearance: No distress  EYES PERRLA, EOM intact.   NECK Supple, No JVD Pulmonary: normal breath sounds, No wheezing.  CardiovascularNormal S1,S2.  No m/r/g.   Abdomen: Benign, Soft, non-tender. Skin:   warm, no rashes, no ecchymosis  Extremities: normal, no cyanosis, clubbing. Neuro:without focal findings,  speech normal  PSYCHIATRIC: Mood, affect within normal limits.   ASSESSMENT AND PLAN  OSA I suspect that OSA is likely present due to clinical presentation. Discussed the consequences of untreated sleep apnea. Advised not to drive drowsy for safety of patient and others. Will complete further evaluation with a home sleep study and follow up to review results.    Morbid obesity Counseled patient on diet and lifestyle modification.    MEDICATION ADJUSTMENTS/LABS AND  TESTS ORDERED: Recommend Sleep Study   Patient  satisfied with Plan of action and management. All questions answered  Follow up to review HST results and treatment plan.   I spent a total of 30 minutes reviewing chart data, face-to-face evaluation with the patient, counseling and coordination of care as detailed above.    Jaryah Aracena, M.D.  Sleep Medicine [facility]

## 2024-07-24 NOTE — Patient Instructions (Signed)
 [name]

## 2024-07-29 ENCOUNTER — Encounter (INDEPENDENT_AMBULATORY_CARE_PROVIDER_SITE_OTHER): Payer: Self-pay | Admitting: Family

## 2024-07-29 DIAGNOSIS — L0291 Cutaneous abscess, unspecified: Secondary | ICD-10-CM | POA: Diagnosis not present

## 2024-07-29 MED ORDER — DOXYCYCLINE HYCLATE 100 MG PO TABS: 100.0000 mg | ORAL_TABLET | Freq: Two times a day (BID) | ORAL | 0 refills | Status: AC

## 2024-07-29 NOTE — Telephone Encounter (Signed)
 Called patient's cell number to make sure she received message but no answer and mail  box was full

## 2024-07-29 NOTE — Telephone Encounter (Signed)

## 2024-07-30 NOTE — Telephone Encounter (Signed)
 Apparently, patient's mounjaro is requiring PA. Can you please send this message to Prior auth team.

## 2024-08-07 ENCOUNTER — Other Ambulatory Visit (HOSPITAL_COMMUNITY): Payer: Self-pay

## 2024-08-07 ENCOUNTER — Telehealth: Payer: Self-pay

## 2024-08-07 NOTE — Telephone Encounter (Signed)
 Pharmacy Patient Advocate Encounter   Received notification from Patient Advice Request messages that prior authorization for Mounjaro  2.5MG /0.5ML auto-injectors is required/requested.   Insurance verification completed.   The patient is insured through [employer].   Per test claim: PA required; PA submitted to above mentioned insurance via Latent Key/confirmation #/EOC [facility] Status is pending

## 2024-08-11 ENCOUNTER — Other Ambulatory Visit (HOSPITAL_COMMUNITY): Payer: Self-pay

## 2024-08-11 NOTE — Telephone Encounter (Signed)
 Pharmacy Patient Advocate Encounter  Received notification from [employer] that Prior Authorization for Mounjaro  2.5MG /0.5ML auto-injectors has been APPROVED from 08/07/24 to 08/11/25. Ran test claim, Copay is $25. This test claim was processed through [facility]- copay amounts may vary at other pharmacies due to pharmacy/plan contracts, or as the patient moves through the different stages of their insurance plan.   PA #/Case ID/Reference #: [id number]

## 2024-09-05 ENCOUNTER — Other Ambulatory Visit: Payer: Self-pay | Admitting: Family

## 2024-09-05 ENCOUNTER — Encounter: Payer: Self-pay | Admitting: Family

## 2024-09-05 DIAGNOSIS — E119 Type 2 diabetes mellitus without complications: Secondary | ICD-10-CM

## 2024-09-05 MED ORDER — TIRZEPATIDE 5 MG/0.5ML ~~LOC~~ SOAJ: 5.0000 mg | SUBCUTANEOUS | 1 refills | Status: DC

## 2024-09-26 ENCOUNTER — Other Ambulatory Visit: Payer: Self-pay | Admitting: Family

## 2024-09-26 DIAGNOSIS — E559 Vitamin D deficiency, unspecified: Secondary | ICD-10-CM

## 2024-09-28 ENCOUNTER — Encounter: Payer: Self-pay | Admitting: Family

## 2024-09-28 NOTE — Telephone Encounter (Signed)
 I saw refill request for vitamin D - needs repeat level first please.

## 2024-09-29 MED ORDER — TIRZEPATIDE 7.5 MG/0.5ML ~~LOC~~ SOAJ: 7.5000 mg | SUBCUTANEOUS | 0 refills | Status: AC

## 2024-09-29 NOTE — Telephone Encounter (Signed)
 See mychart. Pt aware.

## 2024-10-03 ENCOUNTER — Other Ambulatory Visit

## 2024-10-03 DIAGNOSIS — E559 Vitamin D deficiency, unspecified: Secondary | ICD-10-CM | POA: Diagnosis not present

## 2024-10-03 LAB — VITAMIN D 25 HYDROXY (VIT D DEFICIENCY, FRACTURES): VITD: 33.3 ng/mL (ref 30.00–100.00)

## 2024-10-04 ENCOUNTER — Encounter

## 2024-10-04 DIAGNOSIS — G4733 Obstructive sleep apnea (adult) (pediatric): Secondary | ICD-10-CM

## 2024-10-05 ENCOUNTER — Other Ambulatory Visit: Payer: Self-pay | Admitting: Family

## 2024-10-05 ENCOUNTER — Encounter: Payer: Self-pay | Admitting: Family

## 2024-10-05 DIAGNOSIS — E559 Vitamin D deficiency, unspecified: Secondary | ICD-10-CM

## 2024-10-05 MED ORDER — VITAMIN D3 75 MCG (3000 UT) PO TABS: 1.0000 | ORAL_TABLET | Freq: Every day | ORAL | Status: AC

## 2024-10-31 ENCOUNTER — Ambulatory Visit: Admitting: Family

## 2024-11-03 ENCOUNTER — Ambulatory Visit: Admitting: Sleep Medicine
